# Patient Record
Sex: Female | Born: 1945 | Race: White | Hispanic: No | Smoking: Former smoker
Health system: Southern US, Community
[De-identification: ages and names within clinical notes are randomized; demographics above are authoritative.]

## PROBLEM LIST (undated history)

## (undated) DIAGNOSIS — K56609 Unspecified intestinal obstruction, unspecified as to partial versus complete obstruction: Secondary | ICD-10-CM

## (undated) DIAGNOSIS — F419 Anxiety disorder, unspecified: Secondary | ICD-10-CM

## (undated) DIAGNOSIS — M1990 Unspecified osteoarthritis, unspecified site: Secondary | ICD-10-CM

## (undated) DIAGNOSIS — G629 Polyneuropathy, unspecified: Secondary | ICD-10-CM

## (undated) DIAGNOSIS — E079 Disorder of thyroid, unspecified: Secondary | ICD-10-CM

## (undated) DIAGNOSIS — F32A Depression, unspecified: Secondary | ICD-10-CM

## (undated) DIAGNOSIS — F329 Major depressive disorder, single episode, unspecified: Secondary | ICD-10-CM

## (undated) DIAGNOSIS — Z5189 Encounter for other specified aftercare: Secondary | ICD-10-CM

## (undated) HISTORY — PX: COLON SURGERY: SHX602

## (undated) HISTORY — DX: Unspecified osteoarthritis, unspecified site: M19.90

## (undated) HISTORY — PX: LAPAROSCOPIC CHOLECYSTECTOMY: SUR755

## (undated) HISTORY — PX: APPENDECTOMY: SHX54

## (undated) HISTORY — PX: ABDOMINAL SURGERY: SHX537

## (undated) HISTORY — PX: HERNIA REPAIR: SHX51

## (undated) HISTORY — DX: Encounter for other specified aftercare: Z51.89

## (undated) HISTORY — PX: ABDOMINAL HYSTERECTOMY: SHX81

---

## 2017-03-27 ENCOUNTER — Observation Stay
Admission: EM | Admit: 2017-03-27 | Discharge: 2017-03-29 | Disposition: A | Discharge status: Other Acute Inpatient Hospital | Payer: Medicare Other | Attending: Internal Medicine | Admitting: Internal Medicine

## 2017-03-27 ENCOUNTER — Emergency Department: Payer: Medicare Other

## 2017-03-27 ENCOUNTER — Encounter: Payer: Self-pay | Admitting: Emergency Medicine

## 2017-03-27 DIAGNOSIS — Z79899 Other long term (current) drug therapy: Secondary | ICD-10-CM | POA: Diagnosis not present

## 2017-03-27 DIAGNOSIS — Z87891 Personal history of nicotine dependence: Secondary | ICD-10-CM | POA: Diagnosis not present

## 2017-03-27 DIAGNOSIS — E785 Hyperlipidemia, unspecified: Secondary | ICD-10-CM | POA: Diagnosis not present

## 2017-03-27 DIAGNOSIS — I639 Cerebral infarction, unspecified: Secondary | ICD-10-CM | POA: Diagnosis present

## 2017-03-27 DIAGNOSIS — E039 Hypothyroidism, unspecified: Secondary | ICD-10-CM | POA: Diagnosis not present

## 2017-03-27 DIAGNOSIS — G9589 Other specified diseases of spinal cord: Principal | ICD-10-CM | POA: Insufficient documentation

## 2017-03-27 DIAGNOSIS — G629 Polyneuropathy, unspecified: Secondary | ICD-10-CM | POA: Insufficient documentation

## 2017-03-27 DIAGNOSIS — G9520 Unspecified cord compression: Secondary | ICD-10-CM | POA: Diagnosis present

## 2017-03-27 DIAGNOSIS — F419 Anxiety disorder, unspecified: Secondary | ICD-10-CM | POA: Insufficient documentation

## 2017-03-27 DIAGNOSIS — R262 Difficulty in walking, not elsewhere classified: Secondary | ICD-10-CM | POA: Diagnosis not present

## 2017-03-27 DIAGNOSIS — F329 Major depressive disorder, single episode, unspecified: Secondary | ICD-10-CM | POA: Diagnosis not present

## 2017-03-27 DIAGNOSIS — E538 Deficiency of other specified B group vitamins: Secondary | ICD-10-CM | POA: Insufficient documentation

## 2017-03-27 DIAGNOSIS — R29898 Other symptoms and signs involving the musculoskeletal system: Secondary | ICD-10-CM

## 2017-03-27 DIAGNOSIS — R531 Weakness: Secondary | ICD-10-CM | POA: Diagnosis present

## 2017-03-27 DIAGNOSIS — K589 Irritable bowel syndrome without diarrhea: Secondary | ICD-10-CM | POA: Diagnosis not present

## 2017-03-27 HISTORY — DX: Unspecified intestinal obstruction, unspecified as to partial versus complete obstruction: K56.609

## 2017-03-27 HISTORY — DX: Polyneuropathy, unspecified: G62.9

## 2017-03-27 HISTORY — DX: Major depressive disorder, single episode, unspecified: F32.9

## 2017-03-27 HISTORY — DX: Disorder of thyroid, unspecified: E07.9

## 2017-03-27 HISTORY — DX: Depression, unspecified: F32.A

## 2017-03-27 HISTORY — DX: Anxiety disorder, unspecified: F41.9

## 2017-03-27 LAB — CBC
HEMATOCRIT: 40.2 % (ref 35.0–47.0)
HEMOGLOBIN: 13.5 g/dL (ref 12.0–16.0)
MCH: 28.7 pg (ref 26.0–34.0)
MCHC: 33.6 g/dL (ref 32.0–36.0)
MCV: 85.5 fL (ref 80.0–100.0)
Platelets: 215 10*3/uL (ref 150–440)
RBC: 4.7 MIL/uL (ref 3.80–5.20)
RDW: 13.4 % (ref 11.5–14.5)
WBC: 6.5 10*3/uL (ref 3.6–11.0)

## 2017-03-27 LAB — COMPREHENSIVE METABOLIC PANEL
ALK PHOS: 72 U/L (ref 38–126)
ALT: 22 U/L (ref 14–54)
ANION GAP: 8 (ref 5–15)
AST: 27 U/L (ref 15–41)
Albumin: 4.3 g/dL (ref 3.5–5.0)
BILIRUBIN TOTAL: 0.6 mg/dL (ref 0.3–1.2)
BUN: 16 mg/dL (ref 6–20)
CALCIUM: 9.4 mg/dL (ref 8.9–10.3)
CO2: 26 mmol/L (ref 22–32)
Chloride: 108 mmol/L (ref 101–111)
Creatinine, Ser: 1.05 mg/dL — ABNORMAL HIGH (ref 0.44–1.00)
GFR calc non Af Amer: 52 mL/min — ABNORMAL LOW (ref >60)
GFR, EST AFRICAN AMERICAN: 60 mL/min — AB (ref >60)
Glucose, Bld: 110 mg/dL — ABNORMAL HIGH (ref 65–99)
POTASSIUM: 3.4 mmol/L — AB (ref 3.5–5.1)
Sodium: 142 mmol/L (ref 135–145)
TOTAL PROTEIN: 7.1 g/dL (ref 6.5–8.1)

## 2017-03-27 MED ORDER — ACETAMINOPHEN 650 MG RE SUPP: 650.0000 mg | Freq: Four times a day (QID) | RECTAL | Status: DC | PRN

## 2017-03-27 MED ORDER — DICYCLOMINE HCL 10 MG PO CAPS: 10.0000 mg | ORAL_CAPSULE | Freq: Three times a day (TID) | ORAL | Status: DC | PRN

## 2017-03-27 MED ORDER — ONDANSETRON HCL 4 MG/2ML IJ SOLN: 4.0000 mg | Freq: Four times a day (QID) | INTRAMUSCULAR | Status: DC | PRN

## 2017-03-27 MED ORDER — MIRTAZAPINE 15 MG PO TABS
30.0000 mg | ORAL_TABLET | Freq: Every day | ORAL | Status: DC
  Administered 2017-03-28: 21:00:00 30 mg via ORAL
  Administered 2017-03-28: 15 mg via ORAL
  Filled 2017-03-27 (×2): qty 2

## 2017-03-27 MED ORDER — ACETAMINOPHEN 325 MG PO TABS: 650.0000 mg | ORAL_TABLET | Freq: Four times a day (QID) | ORAL | Status: DC | PRN

## 2017-03-27 MED ORDER — LOPERAMIDE HCL 2 MG PO CAPS
2.0000 mg | ORAL_CAPSULE | ORAL | Status: DC | PRN
  Administered 2017-03-28 (×2): 2 mg via ORAL
  Filled 2017-03-27 (×2): qty 1

## 2017-03-27 MED ORDER — HYDRALAZINE HCL 20 MG/ML IJ SOLN: 10.0000 mg | Freq: Four times a day (QID) | INTRAMUSCULAR | Status: DC | PRN

## 2017-03-27 MED ORDER — ENOXAPARIN SODIUM 40 MG/0.4ML ~~LOC~~ SOLN
40.0000 mg | SUBCUTANEOUS | Status: DC
  Administered 2017-03-28: 21:00:00 40 mg via SUBCUTANEOUS
  Filled 2017-03-27: qty 0.4

## 2017-03-27 MED ORDER — ONDANSETRON HCL 4 MG PO TABS
4.0000 mg | ORAL_TABLET | Freq: Four times a day (QID) | ORAL | Status: DC | PRN
  Administered 2017-03-28: 4 mg via ORAL
  Filled 2017-03-27: qty 1

## 2017-03-27 MED ORDER — ASPIRIN EC 81 MG PO TBEC
81.0000 mg | DELAYED_RELEASE_TABLET | Freq: Every day | ORAL | Status: DC
  Administered 2017-03-28 – 2017-03-29 (×2): 81 mg via ORAL
  Filled 2017-03-27 (×2): qty 1

## 2017-03-27 MED ORDER — LEVOTHYROXINE SODIUM 88 MCG PO TABS
88.0000 ug | ORAL_TABLET | Freq: Every day | ORAL | Status: DC
  Administered 2017-03-28: 10:00:00 88 ug via ORAL
  Filled 2017-03-27: qty 1

## 2017-03-27 MED ORDER — SIMVASTATIN 20 MG PO TABS
20.0000 mg | ORAL_TABLET | Freq: Every day | ORAL | Status: DC
  Administered 2017-03-28 (×2): 20 mg via ORAL
  Filled 2017-03-27 (×2): qty 1

## 2017-03-27 NOTE — ED Provider Notes (Signed)
[facility] Emergency Department Provider Note   ____________________________________________   First MD Initiated Contact with Patient 03/27/17 1745     (approximate)  I have reviewed the triage vital signs and the nursing notes.   HISTORY  Chief Complaint Gait Problem    HPI Alison Torres is a 71 y.o. female here for evaluation of increasing trouble with walking  Patient reports that she's been having lower back discomfort for several weeks, saw a chiropractor and seem to see some improvement except she has noticed a slightly increasing weakness in her left lower leg.She reports she is been eiencing a mild feeling of tingling in the left lower leg, alsohaving a "dropped" and dragging the left leg for a few weeks now. Symptoms seem to be very slowly worseningt is difficulto walk. She reports she'll walk and sometimes will fall but is not injured herself except for some bruises on her knees.  She has seen her doctor at the [place], but reports they only recommended she use a cane recently.  Some slight discomfort in her very lower back for several months, but denies any severe change in pain. No nausea or vomiting. No chest pain. No cold or blue foot.  Denies history of cancer. No trouble urinating. No incontinence. No numbness around the buttock.   Past Medical History:  Diagnosis Date  . Anxiety   . Bowel obstruction (HCC)   . Depression   . Neuropathy   . Thyroid disease     There are no active problems to display for this patient.   Past Surgical History:  Procedure Laterality Date  . ABDOMINAL HYSTERECTOMY    . ABDOMINAL SURGERY      Prior to Admission medications   Not on File    Allergies Codeine  History reviewed. No pertinent family history.  Social History Social History  Substance Use Topics  . Smoking status: Never Smoker  . Smokeless tobacco: Never Used  . Alcohol use No    Review of Systems Constitutional: No  fever/chills Eyes: No visual changes. ENT: No sore throat. Cardiovascular: Denies chest pain. Respiratory: Denies shortness of breath. Gastrointestinal: No abdominal pain.  No nausea, no vomiting.  No diarrhea.  No constipation. Genitourinary: Negative for dysuria. Musculoskeletal: Negative for back pain. Skin: Negative for rash. Neurological: Negative for headaches, trouble speaking, or weakness in her face and arms or hands. her right leg does not feel weak, but her left leg definitely    ____________________________________________   PHYSICAL EXAM:  VITAL SIGNS: ED Triage Vitals  Enc Vitals Group     BP 03/27/17 1720 (!) 147/83     Pulse Rate 03/27/17 1720 (!) 106     Resp 03/27/17 1720 18     Temp 03/27/17 1720 98.4 F (36.9 C)     Temp Source 03/27/17 1720 Oral     SpO2 03/27/17 1720 96 %     Weight 03/27/17 1720 178 lb (80.7 kg)     Height 03/27/17 1720 5\' 6"  (1.676 m)     Head Circumference --      Peak Flow --      Pain Score 03/27/17 1719 0     Pain Loc --      Pain Edu? --      Excl. in GC? --     Constitutional: Alert and oriented. Well appearing and in no acute distress. Eyes: Conjunctivae are normal. Head: Atraumatic. Nose: No congestion/rhinnorhea. Mouth/Throat: Mucous membranes are moist. Neck: No stridor.  Cardiovascular: Normal rate, regular rhythm. Grossly normal heart sounds.  Good peripheral circulation. Respiratory: Normal respiratory effort.  No retractions. Lungs CTAB. Gastrointestinal: Soft and nontender. No distention. Musculoskeletal: No lower extremity tenderness nor edema. Neurologic:  Normal speech and language.   Lower Extremities  No edema. Normal DP/PT pulses are dopplerable bilateral with good cap refill.   Normal neuro-motor function lower extremities bilateral.  RIGHT Right lower extremity demonstrates normal strength, good use of all muscles. No edema bruising or contusions of the right hip, right knee, right ankle. Full  range of motion of the right lower extremity without pain. No pain on axial loading. No evidence of trauma.  LEFT Left lower extremity demonstrates decreased strength, especially notable with foot dorsiflexion where she has about 2 out of 5 s,4-5 to plantar flexion.  No edema bruising or contusions of the hip, ankle.mild contusion across the left knee but denies pain. Full range of motion of the left lower extremity without painto passive mo. No pain on axial loading. Able to wiggle the toes bilateral, slightly worse in the left leg. Minimally diminished reflexes in the left knee, normal reflexes right leg.   Skin:  Skin is warm, dry and intact. No rash noted. Psychiatric: Mood and affect are normal. Speech and behavior are normal.  ____________________________________________   LABS (all labs ordered are listed, but only abnormal results are displayed)  Labs Reviewed  COMPREHENSIVE METABOLIC PANEL - Abnormal; Notable for the following:       Result Value   Potassium 3.4 (*)    Glucose, Bld 110 (*)    Creatinine, Ser 1.05 (*)    GFR calc non Af Amer 52 (*)    GFR calc Af Amer 60 (*)    All other components within normal limits  CBC   ____________________________________________  EKG   ____________________________________________  RADIOLOGY  Mr Lumbar Spine Wo Contrast  Result Date: 03/27/2017 CLINICAL DATA:  Initial evaluation for back pain, lower extremity weakness. EXAM: MRI LUMBAR SPINE WITHOUT CONTRAST TECHNIQUE: Multiplanar, multisequence MR imaging of the lumbar spine was performed. No intravenous contrast was administered. COMPARISON:  None available. FINDINGS: Segmentation: Study degraded by motion artifact. Normal segmentation. Lowest well-formed disc labeled the L5-S1 level. Alignment: Mild levoscoliosis. Vertebral bodies otherwise normally aligned with preservation of the normal lumbar lordosis. No listhesis. Vertebrae: Vertebral body heights are maintained. No evidence  for acute or chronic fracture. Schmorl's node deformity noted about the L3-4 and L4-5 interspaces. Signal intensity within the vertebral body bone marrow within normal limits. No worrisome osseous lesions. No abnormal marrow edema. Conus medullaris: Extends to the L1 level and appears normal. Paraspinal and other soft tissues: Paraspinous soft tissues within normal limits. Visualized visceral structures are normal. Disc levels: L1-2:  Minimal annular disc bulge.  No stenosis. L2-3:  Mild annular disc bulge.  No stenosis. L3-4: Diffuse degenerative disc bulge with intervertebral disc space narrowing and mild reactive endplate changes. No focal disc protrusion. Facet and ligamentum flavum hypertrophy. Mild spinal stenosis. No significant foraminal encroachment. L4-5: Mild diffuse disc bulge, eccentric to the right. Right far lateral reactive endplate changes. Right greater than left facet arthrosis. Small reactive effusions present within the bilateral L4-5 facets. No significant canal or lateral recess stenosis. Mild right L4 foraminal stenosis. No significant left foraminal encroachment. L5-S1:  Negative disc.  Mild facet hypertrophy.  No stenosis. IMPRESSION: 1. No acute abnormality within the lumbar spine. No significant canal stenosis or evidence for cord compression. 2. Mild degenerative disc disease and facet  arthrosis at L3-4 and L4-5 as above without significant stenosis. Electronically Signed   By: Rise Mu M.D.   On: 03/27/2017 19:16    ____________________________________________   PROCEDURES  Procedure(s) performed: None  Procedures  Critical Care performed: No  ____________________________________________   INITIAL IMPRESSION / ASSESSMENT AND PLAN / ED COURSE  Pertinent labs & imaging results that were available during my care of the patient were reviewed by me and considered in my medical decision making (see chart for details).  atient presents for evaluation ocreasing,  rather ins worsening of weakness in leg primarily e left and difficulty walking. No discernible deficits in the right lower leg, but clearly weakness especially to dorsiflexion in the left footmay be some mild weakness involving the left hip flexors and extensors. This is associated with a mild lower back painfectious symptoms. No history cancer. Denies significant pain.Normal neurologic exam the cranial nerves and upper extremities bilateral. No falls or injuries except onto her knees yesterday.   ----------------------------------------- 8:27 PM on 03/27/2017 -----------------------------------------  MRI unrevealing of lesion to explain symptoms. Her symptoms sound to be that of a possibly progressive neurologic disorder. Discussed with patient, and believe she is stable for outpatient follow-up with neurology at this time, but she is able and willing to see telemetry neurology tonight and due to often prolonged time for clinic visits and her worsening disease we discussed and I will have a telemetry neurology consultation performed for further recommendations and examination.  Ongoing care assigned to Dr. Roxan Hockey, follow up on CT of the head and likely disposition based on telemetry neurology recommendations.       ____________________________________________   FINAL CLINICAL IMPRESSION(S) / ED DIAGNOSES  Final diagnoses:  Left leg weakness      NEW MEDICATIONS STARTED DURING THIS VISIT:  New Prescriptions   No medications on file     Note:  This document was prepared using Dragon voice recognition software and may include unintentional dictation errors.     Sharyn Creamer, MD 03/27/17 2039

## 2017-03-27 NOTE — ED Triage Notes (Signed)
Pt states has been having difficulty walking x 6-7 weeks. Hx of neuropathy in feet. States went to chiropractor and it helped but not better. States can take a few steps but falls. Uses cane and today started using a walker. [facility] .

## 2017-03-27 NOTE — ED Provider Notes (Addendum)
Patient received in sign-out from Dr. Fanny BienQuale.  Workup and evaluation pending neuro consult.  Neurology is counseled patient based on her symptoms of progressively worsening weakness and inability to walk he has recommended MRI brain, C-spine and thoracic spine due to concern for mass occupying lesion versus demyelinating disease. This is normal other differentials do include Guillain-Barr. Do not feel that lumbar puncture emergently indicated at this moment. At this point I do feel the patient will benefit from admission to the hospital for further evaluation and management.      Willy Eddyobinson, Caidan Hubbert, MD 03/27/17 2109    Willy Eddyobinson, Derion Kreiter, MD 03/27/17 2141

## 2017-03-27 NOTE — ED Notes (Signed)
Called MRI tech

## 2017-03-27 NOTE — ED Notes (Signed)
[facility]

## 2017-03-27 NOTE — H&P (Signed)
[facility] - [place] [facility]   PATIENT NAME: Alison Torres    MR#:  [id number]  DATE OF BIRTH:  1946/02/23  DATE OF ADMISSION:  03/27/2017  PRIMARY CARE PHYSICIAN: [facility]   REQUESTING/REFERRING PHYSICIAN: Dr. Willy Eddy  CHIEF COMPLAINT:   Chief Complaint  Patient presents with  . Gait Problem    HISTORY OF PRESENT ILLNESS:  Alison Torres  is a 71 y.o. female with a known history of Depression, previous history of bowel obstruction, neuropathy, hypothyroidism, anxiety who presents to the hospital due to difficulty walking and frequent falls. Patient says that she has not been able to walk now for the past 2 weeks and has been having frequent falls at home. She describes a feeling that she gets up and bears weight on her legs and her legs gave way and she falls. She has multiple bruises on lower extremities from her frequent falls. She presented to the ER for further evaluation. Patient underwent a CT scan of the head and MRI of the lumbar spine which showed no acute pathology. Tele-Neurology was consulted who recommended admission and further neurological evaluation and further testing. She denies any chest pain, shortness of breath, nausea, vomiting, abdominal pain, fever or chills cough or any other associated symptoms. Patient does complain of some numbness and tingling of her lower extremities which is chronic and attributed to some neuropathy.  PAST MEDICAL HISTORY:   Past Medical History:  Diagnosis Date  . Anxiety   . Bowel obstruction (HCC)   . Depression   . Neuropathy   . Thyroid disease     PAST SURGICAL HISTORY:   Past Surgical History:  Procedure Laterality Date  . ABDOMINAL HYSTERECTOMY    . ABDOMINAL SURGERY      SOCIAL HISTORY:   Social History  Substance Use Topics  . Smoking status: Former Smoker    Packs/day: 1.00    Years: 20.00    Types: Cigarettes  . Smokeless tobacco: Never Used  . Alcohol use Yes      Comment: Used to drink heavily but quit 30 yrs ago.     FAMILY HISTORY:   Family History  Problem Relation Age of Onset  . Adopted: Yes    DRUG ALLERGIES:   Allergies  Allergen Reactions  . Codeine   . Trazodone And Nefazodone     REVIEW OF SYSTEMS:   Review of Systems  Constitutional: Negative for fever and weight loss.  HENT: Negative for congestion, nosebleeds and tinnitus.   Eyes: Negative for blurred vision, double vision and redness.  Respiratory: Negative for cough, hemoptysis and shortness of breath.   Cardiovascular: Negative for chest pain, orthopnea, leg swelling and PND.  Gastrointestinal: Negative for abdominal pain, diarrhea, melena, nausea and vomiting.  Genitourinary: Negative for dysuria, hematuria and urgency.  Musculoskeletal: Positive for falls. Negative for joint pain.  Neurological: Positive for tingling and weakness. Negative for dizziness, sensory change, focal weakness, seizures and headaches.  Endo/Heme/Allergies: Negative for polydipsia. Does not bruise/bleed easily.  Psychiatric/Behavioral: Negative for depression and memory loss. The patient is not nervous/anxious.     MEDICATIONS AT HOME:   Prior to Admission medications   Medication Sig Start Date End Date Taking? Authorizing Provider  dicyclomine (BENTYL) 10 MG capsule Take 10 mg by mouth 3 (three) times daily as needed for spasms.   Yes [provider]  ibuprofen (ADVIL,MOTRIN) 800 MG tablet Take 800 mg by mouth daily as needed.   Yes [provider]  levothyroxine (SYNTHROID, LEVOTHROID) 88 MCG tablet Take 88 mcg by mouth daily before breakfast.   Yes [provider]  loperamide (IMODIUM) 2 MG capsule Take by mouth 2 (two) times daily as needed for diarrhea or loose stools.   Yes [provider]  mirtazapine (REMERON) 30 MG tablet Take 30 mg by mouth at bedtime.   Yes [provider]  simvastatin (ZOCOR) 20 MG tablet Take 20 mg by mouth at  bedtime.   Yes [provider]      VITAL SIGNS:  Blood pressure (!) 184/84, pulse 81, temperature 98.4 F (36.9 C), temperature source Oral, resp. rate 17, height 5\' 6"  (1.676 m), weight 80.7 kg (178 lb), SpO2 97 %.  PHYSICAL EXAMINATION:  Physical Exam  GENERAL:  71 y.o.-year-old patient lying in the bed in no acute distress.  EYES: Pupils equal, round, reactive to light and accommodation. No scleral icterus. Extraocular muscles intact.  HEENT: Head atraumatic, normocephalic. Oropharynx and nasopharynx clear. No oropharyngeal erythema, moist oral mucosa  NECK:  Supple, no jugular venous distention. No thyroid enlargement, no tenderness.  LUNGS: Normal breath sounds bilaterally, no wheezing, rales, rhonchi. No use of accessory muscles of respiration.  CARDIOVASCULAR: S1, S2 RRR. No murmurs, rubs, gallops, clicks.  ABDOMEN: Soft, nontender, nondistended. Bowel sounds present. No organomegaly or mass.  EXTREMITIES: No pedal edema, cyanosis, or clubbing. + 2 pedal & radial pulses b/l.   NEUROLOGIC: Cranial nerves II through XII are intact. 3 out of 5 strength in the left lower extremities compared to the right. PSYCHIATRIC: The patient is alert and oriented x 3.  SKIN: No obvious rash, lesion, or ulcer.   LABORATORY PANEL:   CBC  Recent Labs Lab 03/27/17 1800  WBC 6.5  HGB 13.5  HCT 40.2  PLT 215   ------------------------------------------------------------------------------------------------------------------  Chemistries   Recent Labs Lab 03/27/17 1800  NA 142  K 3.4*  CL 108  CO2 26  GLUCOSE 110*  BUN 16  CREATININE 1.05*  CALCIUM 9.4  AST 27  ALT 22  ALKPHOS 72  BILITOT 0.6   ------------------------------------------------------------------------------------------------------------------  Cardiac Enzymes No results for input(s): TROPONINI in the last 168  hours. ------------------------------------------------------------------------------------------------------------------  RADIOLOGY:  Ct Head Wo Contrast  Result Date: 03/27/2017 CLINICAL DATA:  Subacute neuro deficits EXAM: CT HEAD WITHOUT CONTRAST TECHNIQUE: Contiguous axial images were obtained from the base of the skull through the vertex without intravenous contrast. COMPARISON:  None. FINDINGS: Brain: No evidence of acute infarction, hemorrhage, hydrocephalus, extra-axial collection or mass lesion/mass effect. Vascular: No hyperdense vessel or unexpected calcification. Skull: Negative Sinuses/Orbits: Negative Other: None IMPRESSION: Negative CT head Electronically Signed   By: Marlan Palau M.D.   On: 03/27/2017 20:54   Mr Lumbar Spine Wo Contrast  Result Date: 03/27/2017 CLINICAL DATA:  Initial evaluation for back pain, lower extremity weakness. EXAM: MRI LUMBAR SPINE WITHOUT CONTRAST TECHNIQUE: Multiplanar, multisequence MR imaging of the lumbar spine was performed. No intravenous contrast was administered. COMPARISON:  None available. FINDINGS: Segmentation: Study degraded by motion artifact. Normal segmentation. Lowest well-formed disc labeled the L5-S1 level. Alignment: Mild levoscoliosis. Vertebral bodies otherwise normally aligned with preservation of the normal lumbar lordosis. No listhesis. Vertebrae: Vertebral body heights are maintained. No evidence for acute or chronic fracture. Schmorl's node deformity noted about the L3-4 and L4-5 interspaces. Signal intensity within the vertebral body bone marrow within normal limits. No worrisome osseous lesions. No abnormal marrow edema. Conus medullaris: Extends to the L1 level and appears normal. Paraspinal and other soft tissues:  Paraspinous soft tissues within normal limits. Visualized visceral structures are normal. Disc levels: L1-2:  Minimal annular disc bulge.  No stenosis. L2-3:  Mild annular disc bulge.  No stenosis. L3-4: Diffuse  degenerative disc bulge with intervertebral disc space narrowing and mild reactive endplate changes. No focal disc protrusion. Facet and ligamentum flavum hypertrophy. Mild spinal stenosis. No significant foraminal encroachment. L4-5: Mild diffuse disc bulge, eccentric to the right. Right far lateral reactive endplate changes. Right greater than left facet arthrosis. Small reactive effusions present within the bilateral L4-5 facets. No significant canal or lateral recess stenosis. Mild right L4 foraminal stenosis. No significant left foraminal encroachment. L5-S1:  Negative disc.  Mild facet hypertrophy.  No stenosis. IMPRESSION: 1. No acute abnormality within the lumbar spine. No significant canal stenosis or evidence for cord compression. 2. Mild degenerative disc disease and facet arthrosis at L3-4 and L4-5 as above without significant stenosis. Electronically Signed   By: Rise MuBenjamin  McClintock M.D.   On: 03/27/2017 19:16     IMPRESSION AND PLAN:   71 year old female with past medical history of hypothyroidism, history of bowel obstruction, neuropathy, anxiety who presented to the hospital due to frequent falls, lower extremity weakness and difficulty walking.  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 -CT head is negative for acute pathology. Patient also had a lumbar spine MRI which shows no evidence of spinal stenosis or any acute process to explain her weakness. - Tele neurology was consulted in the ER recommended admission and further stroke workup. -We'll get MRI of the brain, carotid duplex, echocardiogram, get a neurology consult. Start patient on aspirin. Get a physical therapy evaluation.  2. History of IBS and bowel obstruction-continue Bentyl  3. Hypothyroidism-continue Synthroid.  4. Hyperlipidemia-continue Zocor.    All the records are reviewed and case discussed with ED provider. Management plans  discussed with the patient, family and they are in agreement.  CODE STATUS: Full  TOTAL TIME TAKING CARE OF THIS PATIENT: 45 minutes.    Houston SirenSAINANI,VIVEK J M.D on 03/27/2017 at 9:34 PM  Between 7am to 6pm - Pager - [phone]  After 6pm go to [email or web address] - password [email or web address]  [facility]  Office  [phone]  CC: Primary care physician; [facility]

## 2017-03-27 NOTE — ED Notes (Signed)
Performed doppler on bilateral feet - right dorsal foot 68 bpm; left dorsal foot 64 bpm

## 2017-03-28 ENCOUNTER — Observation Stay: Payer: Medicare Other

## 2017-03-28 ENCOUNTER — Observation Stay (HOSPITAL_BASED_OUTPATIENT_CLINIC_OR_DEPARTMENT_OTHER)
Admit: 2017-03-28 | Discharge: 2017-03-28 | Disposition: A | Discharge status: Home / Self Care | Payer: Medicare Other | Attending: Specialist | Admitting: Specialist

## 2017-03-28 DIAGNOSIS — G959 Disease of spinal cord, unspecified: Secondary | ICD-10-CM | POA: Diagnosis not present

## 2017-03-28 DIAGNOSIS — I6350 Cerebral infarction due to unspecified occlusion or stenosis of unspecified cerebral artery: Secondary | ICD-10-CM

## 2017-03-28 DIAGNOSIS — R29898 Other symptoms and signs involving the musculoskeletal system: Secondary | ICD-10-CM | POA: Diagnosis not present

## 2017-03-28 LAB — T4, FREE: FREE T4: 0.84 ng/dL (ref 0.61–1.12)

## 2017-03-28 LAB — MAGNESIUM: Magnesium: 2 mg/dL (ref 1.7–2.4)

## 2017-03-28 LAB — ECHOCARDIOGRAM COMPLETE
HEIGHTINCHES: 66 in
WEIGHTICAEL: 2916.8 [oz_av]

## 2017-03-28 LAB — BASIC METABOLIC PANEL
Anion gap: 7 (ref 5–15)
BUN: 19 mg/dL (ref 6–20)
CALCIUM: 8.9 mg/dL (ref 8.9–10.3)
CO2: 27 mmol/L (ref 22–32)
Chloride: 108 mmol/L (ref 101–111)
Creatinine, Ser: 1.07 mg/dL — ABNORMAL HIGH (ref 0.44–1.00)
GFR, EST AFRICAN AMERICAN: 59 mL/min — AB (ref >60)
GFR, EST NON AFRICAN AMERICAN: 51 mL/min — AB (ref >60)
Glucose, Bld: 115 mg/dL — ABNORMAL HIGH (ref 65–99)
POTASSIUM: 3.3 mmol/L — AB (ref 3.5–5.1)
Sodium: 142 mmol/L (ref 135–145)

## 2017-03-28 LAB — TSH: TSH: 14.442 u[IU]/mL — ABNORMAL HIGH (ref 0.350–4.500)

## 2017-03-28 LAB — CBC
HEMATOCRIT: 36.1 % (ref 35.0–47.0)
HEMOGLOBIN: 12.2 g/dL (ref 12.0–16.0)
MCH: 28.9 pg (ref 26.0–34.0)
MCHC: 33.8 g/dL (ref 32.0–36.0)
MCV: 85.3 fL (ref 80.0–100.0)
Platelets: 192 10*3/uL (ref 150–440)
RBC: 4.23 MIL/uL (ref 3.80–5.20)
RDW: 13.2 % (ref 11.5–14.5)
WBC: 5.6 10*3/uL (ref 3.6–11.0)

## 2017-03-28 LAB — FOLATE: Folate: 7.7 ng/mL (ref >5.9)

## 2017-03-28 LAB — SEDIMENTATION RATE: SED RATE: 12 mm/h (ref 0–30)

## 2017-03-28 LAB — CKMB (ARMC ONLY): CK, MB: 2 ng/mL (ref 0.5–5.0)

## 2017-03-28 LAB — VITAMIN B12: Vitamin B-12: 251 pg/mL (ref 180–914)

## 2017-03-28 MED ORDER — IBUPROFEN 400 MG PO TABS: 400.0000 mg | ORAL_TABLET | Freq: Four times a day (QID) | ORAL | Status: DC | PRN

## 2017-03-28 MED ORDER — DIAZEPAM 5 MG PO TABS
5.0000 mg | ORAL_TABLET | Freq: Once | ORAL | Status: AC | PRN
  Administered 2017-03-29: 5 mg via ORAL
  Filled 2017-03-28: qty 1

## 2017-03-28 MED ORDER — ZOLPIDEM TARTRATE 5 MG PO TABS
5.0000 mg | ORAL_TABLET | Freq: Every evening | ORAL | Status: DC | PRN
  Administered 2017-03-28: 22:00:00 5 mg via ORAL
  Filled 2017-03-28: qty 1

## 2017-03-28 MED ORDER — POTASSIUM CHLORIDE CRYS ER 20 MEQ PO TBCR
40.0000 meq | EXTENDED_RELEASE_TABLET | ORAL | Status: AC
  Administered 2017-03-28 (×2): 40 meq via ORAL
  Filled 2017-03-28 (×2): qty 2

## 2017-03-28 MED ORDER — LEVOTHYROXINE SODIUM 100 MCG PO TABS
100.0000 ug | ORAL_TABLET | Freq: Every day | ORAL | Status: DC
  Administered 2017-03-29: 08:00:00 100 ug via ORAL
  Filled 2017-03-28: qty 1

## 2017-03-28 MED ORDER — LORAZEPAM 1 MG PO TABS: 1.0000 mg | ORAL_TABLET | Freq: Once | ORAL | Status: DC | PRN

## 2017-03-28 MED ORDER — KETOROLAC TROMETHAMINE 15 MG/ML IJ SOLN
15.0000 mg | Freq: Three times a day (TID) | INTRAMUSCULAR | Status: DC | PRN
  Administered 2017-03-29: 15 mg via INTRAVENOUS
  Filled 2017-03-28 (×2): qty 1

## 2017-03-28 MED ORDER — CYANOCOBALAMIN 1000 MCG/ML IJ SOLN
1000.0000 ug | Freq: Every day | INTRAMUSCULAR | Status: DC
  Administered 2017-03-28 – 2017-03-29 (×2): 1000 ug via SUBCUTANEOUS
  Filled 2017-03-28 (×2): qty 1

## 2017-03-28 NOTE — Plan of Care (Signed)
Problem: Education: Goal: Knowledge of [facility] Education information/materials will improve Outcome: Progressing Oriented to unit, including call bell, FPP, dietary.  BP elevated, taken on LUA.  Rechecked on L wrist, VSS.  NIH 4 for LLE weakness/ataxia.  Neuro checks stable.  Denies pain.  Reports insomnia, received scheduled mirtazapine 15mg  per pt preference, states need for stronger sleep med if still admitted tonight.  No other complaints overnight.  Compliant w/ q2h repositioning for skin integrity.  Bed in low position, bed alarm on.  Call bell within reach, WCTM.

## 2017-03-28 NOTE — Care Management (Signed)
Patient from home with Aunt and daughter lives next door. Patient states she cannot walk however this is something new. PT evaluation pending.

## 2017-03-28 NOTE — Consult Note (Signed)
Reason for Consult:LE weakness Referring Physician: Sudini  CC: LE weakness  HPI: Alison Torres is an 71 y.o. female who reports that about 7-8 weeks ago she began to notice lower extremity weakness.  She was still able to walk at that time but progressively she became worse.  She saw the chiropractor but this has not helped.  She has fallen frequently.  Over the past 2-3 days she has been unable to walk.  Her left leg is weaker than her right.  She  has no bowel or bladder incontinence.  Although with chronic back pain this has been minimal over this period of time.   Patient with a history of neuropathy that has not progressed over this period of time. At baseline patient is in the bed most of the day on her tablet.    Past Medical History:  Diagnosis Date  . Anxiety   . Bowel obstruction ([place])   . Depression   . Neuropathy   . Thyroid disease     Past Surgical History:  Procedure Laterality Date  . ABDOMINAL HYSTERECTOMY    . ABDOMINAL SURGERY      Family History  Problem Relation Age of Onset  . Adopted: Yes    Social History:  reports that she has quit smoking. Her smoking use included Cigarettes. She has a 20.00 pack-year smoking history. She has never used smokeless tobacco. She reports that she drinks alcohol. She reports that she does not use drugs.  Allergies  Allergen Reactions  . Codeine   . Trazodone And Nefazodone     Medications:  I have reviewed the patient's current medications. Prior to Admission:  Prescriptions Prior to Admission  Medication Sig Dispense Refill Last Dose  . dicyclomine (BENTYL) 10 MG capsule Take 10 mg by mouth 3 (three) times daily as needed for spasms.   03/27/2017 at Unknown time  . ibuprofen (ADVIL,MOTRIN) 800 MG tablet Take 800 mg by mouth daily as needed.     Marland Kitchen levothyroxine (SYNTHROID, LEVOTHROID) 88 MCG tablet Take 88 mcg by mouth daily before breakfast.   03/27/2017 at Unknown time  . loperamide (IMODIUM) 2 MG capsule Take by  mouth 2 (two) times daily as needed for diarrhea or loose stools.     . mirtazapine (REMERON) 30 MG tablet Take 30 mg by mouth at bedtime.   03/26/2017 at Unknown time  . simvastatin (ZOCOR) 20 MG tablet Take 20 mg by mouth at bedtime.   03/26/2017 at Unknown time   Scheduled: . aspirin EC  81 mg Oral Daily  . enoxaparin (LOVENOX) injection  40 mg Subcutaneous Q24H  . levothyroxine  88 mcg Oral QAC breakfast  . mirtazapine  30 mg Oral QHS  . simvastatin  20 mg Oral QHS    ROS: History obtained from the patient  General ROS: negative for - chills, fatigue, fever, night sweats, weight gain or weight loss Psychological ROS: negative for - behavioral disorder, hallucinations, memory difficulties, mood swings or suicidal ideation Ophthalmic ROS: negative for - blurry vision, double vision, eye pain or loss of vision ENT ROS: negative for - epistaxis, nasal discharge, oral lesions, sore throat, tinnitus or vertigo Allergy and Immunology ROS: negative for - hives or itchy/watery eyes Hematological and Lymphatic ROS: negative for - bleeding problems, bruising or swollen lymph nodes Endocrine ROS: negative for - galactorrhea, hair pattern changes, polydipsia/polyuria or temperature intolerance Respiratory ROS: negative for - cough, hemoptysis, shortness of breath or wheezing Cardiovascular ROS: negative for - chest pain, dyspnea  on exertion, edema or irregular heartbeat Gastrointestinal ROS: negative for - abdominal pain, diarrhea, hematemesis, nausea/vomiting or stool incontinence Genito-Urinary ROS: negative for - dysuria, hematuria, incontinence or urinary frequency/urgency Musculoskeletal ROS: back pain Neurological ROS: as noted in HPI Dermatological ROS: negative for rash and skin lesion changes  Physical Examination: Blood pressure (!) 150/66, pulse 78, temperature 98.2 F (36.8 C), temperature source Oral, resp. rate 18, height 5' 6"  (1.676 m), weight 82.7 kg (182 lb 4.8 oz), SpO2 97  %.  HEENT-  Normocephalic, no lesions, without obvious abnormality.  Normal external eye and conjunctiva.  Normal TM's bilaterally.  Normal auditory canals and external ears. Normal external nose, mucus membranes and septum.  Normal pharynx. Cardiovascular- S1, S2 normal, pulses palpable throughout   Lungs- chest clear, no wheezing, rales, normal symmetric air entry Abdomen- soft, non-tender; bowel sounds normal; no masses,  no organomegaly Extremities- no edema Lymph-no adenopathy palpable Musculoskeletal-no joint tenderness, deformity or swelling Skin-multiple lower extremity bruises  Neurological Examination   Mental Status: Alert, oriented, thought content appropriate.  Speech fluent without evidence of aphasia.  Able to follow 3 step commands without difficulty. Cranial Nerves: II: Discs flat bilaterally; Visual fields grossly normal, pupils equal, round, reactive to light and accommodation III,IV, VI: ptosis not present, extra-ocular motions intact bilaterally V,VII: smile symmetric, facial light touch sensation normal bilaterally VIII: hearing normal bilaterally IX,X: gag reflex present XI: bilateral shoulder shrug XII: midline tongue extension Motor: Right : Upper extremity   5/5    Left:     Upper extremity   5/5 The right lower extremity with hip flexor weakness at 3/5.  Patient 5/5 otherwise in the RLE.  3-3+/5 throughout in the LLE.   Sensory: Pinprick and light touch decreased to just above the knees in both lower extremities Deep Tendon Reflexes: 2+ and symmetric throughout Plantars: Right: upgoing   Left: upgoing Cerebellar: Normal finger-to-nose testing bilaterally Gait: not tested due to safety concerns    Laboratory Studies:   Basic Metabolic Panel:  Recent Labs Lab 03/27/17 1800 03/28/17 0416  NA 142 142  K 3.4* 3.3*  CL 108 108  CO2 26 27  GLUCOSE 110* 115*  BUN 16 19  CREATININE 1.05* 1.07*  CALCIUM 9.4 8.9    Liver Function Tests:  Recent  Labs Lab 03/27/17 1800  AST 27  ALT 22  ALKPHOS 72  BILITOT 0.6  PROT 7.1  ALBUMIN 4.3   No results for input(s): LIPASE, AMYLASE in the last 168 hours. No results for input(s): AMMONIA in the last 168 hours.  CBC:  Recent Labs Lab 03/27/17 1800 03/28/17 0416  WBC 6.5 5.6  HGB 13.5 12.2  HCT 40.2 36.1  MCV 85.5 85.3  PLT 215 192    Cardiac Enzymes: No results for input(s): CKTOTAL, CKMB, CKMBINDEX, TROPONINI in the last 168 hours.  BNP: Invalid input(s): POCBNP  CBG: No results for input(s): GLUCAP in the last 168 hours.  Microbiology: No results found for this or any previous visit.  Coagulation Studies: No results for input(s): LABPROT, INR in the last 72 hours.  Urinalysis: No results for input(s): COLORURINE, LABSPEC, PHURINE, GLUCOSEU, HGBUR, BILIRUBINUR, KETONESUR, PROTEINUR, UROBILINOGEN, NITRITE, LEUKOCYTESUR in the last 168 hours.  Invalid input(s): APPERANCEUR  Lipid Panel:  No results found for: CHOL, TRIG, HDL, CHOLHDL, VLDL, LDLCALC  HgbA1C: No results found for: HGBA1C  Urine Drug Screen:  No results found for: LABOPIA, COCAINSCRNUR, LABBENZ, AMPHETMU, THCU, LABBARB  Alcohol Level: No results for input(s): ETH in the  last 168 hours.   Imaging: Ct Head Wo Contrast  Result Date: 03/27/2017 CLINICAL DATA:  Subacute neuro deficits EXAM: CT HEAD WITHOUT CONTRAST TECHNIQUE: Contiguous axial images were obtained from the base of the skull through the vertex without intravenous contrast. COMPARISON:  None. FINDINGS: Brain: No evidence of acute infarction, hemorrhage, hydrocephalus, extra-axial collection or mass lesion/mass effect. Vascular: No hyperdense vessel or unexpected calcification. Skull: Negative Sinuses/Orbits: Negative Other: None IMPRESSION: Negative CT head Electronically Signed   By: Franchot Gallo M.D.   On: 03/27/2017 20:54   Mr Lumbar Spine Wo Contrast  Result Date: 03/27/2017 CLINICAL DATA:  Initial evaluation for back pain, lower  extremity weakness. EXAM: MRI LUMBAR SPINE WITHOUT CONTRAST TECHNIQUE: Multiplanar, multisequence MR imaging of the lumbar spine was performed. No intravenous contrast was administered. COMPARISON:  None available. FINDINGS: Segmentation: Study degraded by motion artifact. Normal segmentation. Lowest well-formed disc labeled the L5-S1 level. Alignment: Mild levoscoliosis. Vertebral bodies otherwise normally aligned with preservation of the normal lumbar lordosis. No listhesis. Vertebrae: Vertebral body heights are maintained. No evidence for acute or chronic fracture. Schmorl's node deformity noted about the L3-4 and L4-5 interspaces. Signal intensity within the vertebral body bone marrow within normal limits. No worrisome osseous lesions. No abnormal marrow edema. Conus medullaris: Extends to the L1 level and appears normal. Paraspinal and other soft tissues: Paraspinous soft tissues within normal limits. Visualized visceral structures are normal. Disc levels: L1-2:  Minimal annular disc bulge.  No stenosis. L2-3:  Mild annular disc bulge.  No stenosis. L3-4: Diffuse degenerative disc bulge with intervertebral disc space narrowing and mild reactive endplate changes. No focal disc protrusion. Facet and ligamentum flavum hypertrophy. Mild spinal stenosis. No significant foraminal encroachment. L4-5: Mild diffuse disc bulge, eccentric to the right. Right far lateral reactive endplate changes. Right greater than left facet arthrosis. Small reactive effusions present within the bilateral L4-5 facets. No significant canal or lateral recess stenosis. Mild right L4 foraminal stenosis. No significant left foraminal encroachment. L5-S1:  Negative disc.  Mild facet hypertrophy.  No stenosis. IMPRESSION: 1. No acute abnormality within the lumbar spine. No significant canal stenosis or evidence for cord compression. 2. Mild degenerative disc disease and facet arthrosis at L3-4 and L4-5 as above without significant stenosis.  Electronically Signed   By: Jeannine Boga M.D.   On: 03/27/2017 19:16   US Carotid Bilateral  Result Date: 03/28/2017 CLINICAL DATA:  71 year old female with a history of stroke. Cardiovascular risk factors include hyperlipidemia EXAM: BILATERAL CAROTID DUPLEX ULTRASOUND TECHNIQUE: Pearline Cables scale imaging, color Doppler and duplex ultrasound were performed of bilateral carotid and vertebral arteries in the neck. COMPARISON:  No prior duplex FINDINGS: Criteria: Quantification of carotid stenosis is based on velocity parameters that correlate the residual internal carotid diameter with NASCET-based stenosis levels, using the diameter of the distal internal carotid lumen as the denominator for stenosis measurement. The following velocity measurements were obtained: RIGHT ICA:  Systolic 80 cm/sec, Diastolic 31 cm/sec CCA:  80 cm/sec SYSTOLIC ICA/CCA RATIO:  1.0 ECA:  83 cm/sec LEFT ICA:  Systolic 90 cm/sec, Diastolic 24 cm/sec CCA:  69 cm/sec SYSTOLIC ICA/CCA RATIO:  1.3 ECA:  79 cm/sec Right Brachial SBP: Not acquired Left Brachial SBP: Not acquired RIGHT CAROTID ARTERY: No significant calcifications of the right common carotid artery. Intermediate waveform maintained. Heterogeneous and partially calcified plaque at the right carotid bifurcation. No significant lumen shadowing. Low resistance waveform of the right ICA. Mild tortuosity RIGHT VERTEBRAL ARTERY: Antegrade flow with low resistance waveform. LEFT  CAROTID ARTERY: No significant calcifications of the left common carotid artery. Intermediate waveform maintained. Heterogeneous and partially calcified plaque at the left carotid bifurcation without significant lumen shadowing. Low resistance waveform of the left ICA. Mild tortuosity LEFT VERTEBRAL ARTERY:  Antegrade flow with low resistance waveform. IMPRESSION: Color duplex indicates minimal heterogeneous and calcified plaque, with no hemodynamically significant stenosis by duplex criteria in the  extracranial cerebrovascular circulation. Signed, Dulcy Fanny. Earleen Newport, DO [facility] Radiology Electronically Signed   By: Corrie Mckusick D.O.   On: 03/28/2017 09:21     Assessment/Plan: 71 year old female presenting with LE weakness that has been progressive over the past 2 months.  Patient with a history of back pain and peripheral neuropathy.  Now unable to ambulate.  LLE weaker than the RLE.  Despite history of PN reflexes intact.  Up going toes but no bowel or bladder incontinence.  MRI of the lumbar spine reviewed and shows nothing to explain current presentation.  Calcium normal.  Further work up recommended.  Recommendations: 1.  Myasthenia panel, B12, B1, RPR, TSH, folate, SPEP, CK, ESR, magnesium, heavy metal screen, serum coppoer 2.  Agree with further imaging of the remainder of the spine and brain.   3.  Will continue to follow with you.    Alexis Goodell, MD Neurology [phone] 03/28/2017, 10:26 AM

## 2017-03-28 NOTE — Progress Notes (Signed)
*  PRELIMINARY RESULTS* Echocardiogram 2D Echocardiogram has been performed.  Cristela BlueHege, Reg Bircher 03/28/2017, 3:45 PM

## 2017-03-28 NOTE — Care Management Obs Status (Signed)
MEDICARE OBSERVATION STATUS NOTIFICATION   Patient Details  Name: Alison HaradaSarah Duggan MRN: [id number] Date of Birth: 05-Jul-1946   Medicare Observation Status Notification Given:  Yes    [name], RN 03/28/2017, 2:24 PM

## 2017-03-28 NOTE — Progress Notes (Signed)
[employer] - [place] [facility]   PATIENT NAME: Alison HaradaSarah Torres    MR#:  [id number]  DATE OF BIRTH:  09/21/1945  SUBJECTIVE:  CHIEF COMPLAINT:   Chief Complaint  Patient presents with  . Gait Problem   Anxious. Family at bedside Still has LLE pain  REVIEW OF SYSTEMS:    Review of Systems  Constitutional: Negative for chills and fever.  HENT: Negative for sore throat.   Eyes: Negative for blurred vision, double vision and pain.  Respiratory: Negative for cough, hemoptysis, shortness of breath and wheezing.   Cardiovascular: Negative for chest pain, palpitations, orthopnea and leg swelling.  Gastrointestinal: Negative for abdominal pain, constipation, diarrhea, heartburn, nausea and vomiting.  Genitourinary: Negative for dysuria and hematuria.  Musculoskeletal: Positive for falls. Negative for back pain and joint pain.  Skin: Negative for rash.  Neurological: Positive for focal weakness and weakness. Negative for sensory change, speech change and headaches.  Endo/Heme/Allergies: Does not bruise/bleed easily.  Psychiatric/Behavioral: Negative for depression. The patient is not nervous/anxious.     DRUG ALLERGIES:   Allergies  Allergen Reactions  . Codeine   . Trazodone And Nefazodone     VITALS:  Blood pressure (!) 150/75, pulse 83, temperature 97.9 F (36.6 C), temperature source Oral, resp. rate 18, height 5\' 6"  (1.676 m), weight 82.7 kg (182 lb 4.8 oz), SpO2 99 %.  PHYSICAL EXAMINATION:   Physical Exam  GENERAL:  71 y.o.-year-old patient lying in the bed with no acute distress.  EYES: Pupils equal, round, reactive to light and accommodation. No scleral icterus. Extraocular muscles intact.  HEENT: Head atraumatic, normocephalic. Oropharynx and nasopharynx clear.  NECK:  Supple, no jugular venous distention. No thyroid enlargement, no tenderness.  LUNGS: Normal breath sounds bilaterally, no wheezing, rales, rhonchi. No use of accessory muscles of  respiration.  CARDIOVASCULAR: S1, S2 normal. No murmurs, rubs, or gallops.  ABDOMEN: Soft, nontender, nondistended. Bowel sounds present. No organomegaly or mass.  EXTREMITIES: No cyanosis, clubbing or edema b/l.    NEUROLOGIC: Cranial nerves II through XII are intact. LLE 3+/5 PSYCHIATRIC: The patient is alert and oriented x 3.  SKIN: No obvious rash, lesion, or ulcer.   LABORATORY PANEL:   CBC  Recent Labs Lab 03/28/17 0416  WBC 5.6  HGB 12.2  HCT 36.1  PLT 192   ------------------------------------------------------------------------------------------------------------------ Chemistries   Recent Labs Lab 03/27/17 1800 03/28/17 0416 03/28/17 1122  NA 142 142  --   K 3.4* 3.3*  --   CL 108 108  --   CO2 26 27  --   GLUCOSE 110* 115*  --   BUN 16 19  --   CREATININE 1.05* 1.07*  --   CALCIUM 9.4 8.9  --   MG  --   --  2.0  AST 27  --   --   ALT 22  --   --   ALKPHOS 72  --   --   BILITOT 0.6  --   --    ------------------------------------------------------------------------------------------------------------------  Cardiac Enzymes No results for input(s): TROPONINI in the last 168 hours. ------------------------------------------------------------------------------------------------------------------  RADIOLOGY:  Ct Head Wo Contrast  Result Date: 03/27/2017 CLINICAL DATA:  Subacute neuro deficits EXAM: CT HEAD WITHOUT CONTRAST TECHNIQUE: Contiguous axial images were obtained from the base of the skull through the vertex without intravenous contrast. COMPARISON:  None. FINDINGS: Brain: No evidence of acute infarction, hemorrhage, hydrocephalus, extra-axial collection or mass lesion/mass effect. Vascular: No hyperdense vessel or unexpected calcification. Skull: Negative Sinuses/Orbits:  Negative Other: None IMPRESSION: Negative CT head Electronically Signed   By: Marlan Palau M.D.   On: 03/27/2017 20:54   Mr Brain Wo Contrast  Result Date: 03/28/2017 CLINICAL  DATA:  Focal neuro deficits, greater than 6 hours, stroke suspect. Unable walk for 2 weeks. Frequent falls at home. Legs gave way. EXAM: MRI HEAD WITHOUT CONTRAST TECHNIQUE: Multiplanar, multiecho pulse sequences of the brain and surrounding structures were obtained without intravenous contrast. COMPARISON:  CT head without contrast 03/27/2017. FINDINGS: Brain: The diffusion-weighted images demonstrate no evidence for acute or subacute infarction. No acute hemorrhage or mass lesion is present. Minimal periventricular T2 changes bilaterally are within normal limits for age. The ventricles are of normal size. No significant extra-axial fluid collection is present. The internal auditory canals are normal bilaterally. The brainstem and cerebellum is normal. Vascular: Flow is present in the major intracranial arteries. Skull and upper cervical spine: Skullbase is normal. The craniocervical junction is normal. Midline sagittal structures are unremarkable. Marrow signal is normal. Sinuses/Orbits: The paranasal sinuses and mastoid air cells are clear. The globes and orbits are within normal limits. IMPRESSION: Negative MRI of the brain. Electronically Signed   By: Marin Roberts M.D.   On: 03/28/2017 13:11   Mr Lumbar Spine Wo Contrast  Result Date: 03/27/2017 CLINICAL DATA:  Initial evaluation for back pain, lower extremity weakness. EXAM: MRI LUMBAR SPINE WITHOUT CONTRAST TECHNIQUE: Multiplanar, multisequence MR imaging of the lumbar spine was performed. No intravenous contrast was administered. COMPARISON:  None available. FINDINGS: Segmentation: Study degraded by motion artifact. Normal segmentation. Lowest well-formed disc labeled the L5-S1 level. Alignment: Mild levoscoliosis. Vertebral bodies otherwise normally aligned with preservation of the normal lumbar lordosis. No listhesis. Vertebrae: Vertebral body heights are maintained. No evidence for acute or chronic fracture. Schmorl's node deformity noted  about the L3-4 and L4-5 interspaces. Signal intensity within the vertebral body bone marrow within normal limits. No worrisome osseous lesions. No abnormal marrow edema. Conus medullaris: Extends to the L1 level and appears normal. Paraspinal and other soft tissues: Paraspinous soft tissues within normal limits. Visualized visceral structures are normal. Disc levels: L1-2:  Minimal annular disc bulge.  No stenosis. L2-3:  Mild annular disc bulge.  No stenosis. L3-4: Diffuse degenerative disc bulge with intervertebral disc space narrowing and mild reactive endplate changes. No focal disc protrusion. Facet and ligamentum flavum hypertrophy. Mild spinal stenosis. No significant foraminal encroachment. L4-5: Mild diffuse disc bulge, eccentric to the right. Right far lateral reactive endplate changes. Right greater than left facet arthrosis. Small reactive effusions present within the bilateral L4-5 facets. No significant canal or lateral recess stenosis. Mild right L4 foraminal stenosis. No significant left foraminal encroachment. L5-S1:  Negative disc.  Mild facet hypertrophy.  No stenosis. IMPRESSION: 1. No acute abnormality within the lumbar spine. No significant canal stenosis or evidence for cord compression. 2. Mild degenerative disc disease and facet arthrosis at L3-4 and L4-5 as above without significant stenosis. Electronically Signed   By: Rise Mu M.D.   On: 03/27/2017 19:16   US Carotid Bilateral  Result Date: 03/28/2017 CLINICAL DATA:  71 year old female with a history of stroke. Cardiovascular risk factors include hyperlipidemia EXAM: BILATERAL CAROTID DUPLEX ULTRASOUND TECHNIQUE: Wallace Cullens scale imaging, color Doppler and duplex ultrasound were performed of bilateral carotid and vertebral arteries in the neck. COMPARISON:  No prior duplex FINDINGS: Criteria: Quantification of carotid stenosis is based on velocity parameters that correlate the residual internal carotid diameter with NASCET-based  stenosis levels, using the diameter of  the distal internal carotid lumen as the denominator for stenosis measurement. The following velocity measurements were obtained: RIGHT ICA:  Systolic 80 cm/sec, Diastolic 31 cm/sec CCA:  80 cm/sec SYSTOLIC ICA/CCA RATIO:  1.0 ECA:  83 cm/sec LEFT ICA:  Systolic 90 cm/sec, Diastolic 24 cm/sec CCA:  69 cm/sec SYSTOLIC ICA/CCA RATIO:  1.3 ECA:  79 cm/sec Right Brachial SBP: Not acquired Left Brachial SBP: Not acquired RIGHT CAROTID ARTERY: No significant calcifications of the right common carotid artery. Intermediate waveform maintained. Heterogeneous and partially calcified plaque at the right carotid bifurcation. No significant lumen shadowing. Low resistance waveform of the right ICA. Mild tortuosity RIGHT VERTEBRAL ARTERY: Antegrade flow with low resistance waveform. LEFT CAROTID ARTERY: No significant calcifications of the left common carotid artery. Intermediate waveform maintained. Heterogeneous and partially calcified plaque at the left carotid bifurcation without significant lumen shadowing. Low resistance waveform of the left ICA. Mild tortuosity LEFT VERTEBRAL ARTERY:  Antegrade flow with low resistance waveform. IMPRESSION: Color duplex indicates minimal heterogeneous and calcified plaque, with no hemodynamically significant stenosis by duplex criteria in the extracranial cerebrovascular circulation. Signed, Yvone Neu. Loreta Ave, DO [facility] Rad[facility] Electronically Signed   By: Gilmer Mor D.O.   On: 03/28/2017 09:21     ASSESSMENT AND PLAN:   71 year old female with past medical history of hypothyroidism, history of bowel obstruction, neuropathy, anxiety who presented to the hospital due to frequent falls, lower extremity weakness and difficulty walking.  1. LLE weakness  MRI brain negative Appreciate neurology input. MRI cervical and thoracic spine pending. Labs pending Will start B12 replacement    2. History of IBS-continue Bentyl  3. Hypothyroidism-continue Synthroid. TSH elevated. Increase dose -->  4. Hyperlipidemia-continue Zocor.  All the records are reviewed and case discussed with Care Management/Social Worker Management plans discussed with the patient, family and they are in agreement.  CODE STATUS: FULL CODE  DVT Prophylaxis: SCDs  TOTAL TIME TAKING CARE OF THIS PATIENT: 35 minutes.   POSSIBLE D/C IN 1-2 DAYS, DEPENDING ON CLINICAL CONDITION.  Milagros Loll R M.D on 03/28/2017 at 8:28 PM  Between 7am to 6pm - Pager - [phone]  After 6pm go to [email or web address] - password [email or web address]  [facility] [place] [facility]  Office  [phone]  CC: Primary care physician; [facility]  Note: This dictation was prepared with Dragon dictation along with smaller phrase technology. Any transcriptional errors that result from this process are unintentional.

## 2017-03-29 ENCOUNTER — Observation Stay: Payer: Medicare Other

## 2017-03-29 DIAGNOSIS — G959 Disease of spinal cord, unspecified: Secondary | ICD-10-CM

## 2017-03-29 DIAGNOSIS — G9520 Unspecified cord compression: Secondary | ICD-10-CM | POA: Diagnosis present

## 2017-03-29 LAB — BASIC METABOLIC PANEL
ANION GAP: 5 (ref 5–15)
BUN: 16 mg/dL (ref 6–20)
CALCIUM: 8.8 mg/dL — AB (ref 8.9–10.3)
CO2: 27 mmol/L (ref 22–32)
Chloride: 109 mmol/L (ref 101–111)
Creatinine, Ser: 0.95 mg/dL (ref 0.44–1.00)
GFR, EST NON AFRICAN AMERICAN: 58 mL/min — AB (ref >60)
GLUCOSE: 101 mg/dL — AB (ref 65–99)
POTASSIUM: 4.4 mmol/L (ref 3.5–5.1)
Sodium: 141 mmol/L (ref 135–145)

## 2017-03-29 LAB — ANTI-SMOOTH MUSCLE ANTIBODY, IGG: F-Actin IgG: 15 Units (ref 0–19)

## 2017-03-29 LAB — RPR: RPR Ser Ql: NONREACTIVE

## 2017-03-29 LAB — STRIATED MUSCLE ANTIBODY: Anti-striation Abs: NEGATIVE

## 2017-03-29 LAB — T3, FREE: T3 FREE: 2.1 pg/mL (ref 2.0–4.4)

## 2017-03-29 MED ORDER — GADOBENATE DIMEGLUMINE 529 MG/ML IV SOLN
20.0000 mL | Freq: Once | INTRAVENOUS | Status: AC | PRN
  Administered 2017-03-29: 17 mL via INTRAVENOUS

## 2017-03-29 MED ORDER — DEXAMETHASONE SODIUM PHOSPHATE 4 MG/ML IJ SOLN: 4.0000 mg | Freq: Three times a day (TID) | INTRAMUSCULAR | Status: DC

## 2017-03-29 MED ORDER — PANTOPRAZOLE SODIUM 40 MG IV SOLR
40.0000 mg | INTRAVENOUS | Status: DC
Start: 2017-03-29 — End: 2017-03-29
  Administered 2017-03-29: 40 mg via INTRAVENOUS
  Filled 2017-03-29: qty 40

## 2017-03-29 MED ORDER — DEXAMETHASONE SODIUM PHOSPHATE 10 MG/ML IJ SOLN
10.0000 mg | Freq: Once | INTRAMUSCULAR | Status: AC
  Administered 2017-03-29: 14:00:00 10 mg via INTRAVENOUS
  Filled 2017-03-29: qty 1

## 2017-03-29 NOTE — Discharge Summary (Signed)
[facility]   PATIENT NAME: Alison Torres    MR#:  [id number]  DATE OF BIRTH:  09-09-1945  DATE OF ADMISSION:  03/27/2017 ADMITTING PHYSICIAN: Houston Siren, MD  DATE OF DISCHARGE: 03/29/2017  PRIMARY CARE PHYSICIAN: [facility]   ADMISSION DIAGNOSIS:  Left leg weakness [R29.898]  DISCHARGE DIAGNOSIS:  Active Problems:   Compression of spinal cord (HCC)   SECONDARY DIAGNOSIS:   Past Medical History:  Diagnosis Date  . Anxiety   . Bowel obstruction (HCC)   . Depression   . Neuropathy   . Thyroid disease      ADMITTING HISTORY  HISTORY OF PRESENT ILLNESS:  Alison Torres  is a 71 y.o. female with a known history of Depression, previous history of bowel obstruction, neuropathy, hypothyroidism, anxiety who presents to the hospital due to difficulty walking and frequent falls. Patient says that she has not been able to walk now for the past 2 weeks and has been having frequent falls at home. She describes a feeling that she gets up and bears weight on her legs and her legs gave way and she falls. She has multiple bruises on lower extremities from her frequent falls. She presented to the ER for further evaluation. Patient underwent a CT scan of the head and MRI of the lumbar spine which showed no acute pathology. Tele-Neurology was consulted who recommended admission and further neurological evaluation and further testing. She denies any chest pain, shortness of breath, nausea, vomiting, abdominal pain, fever or chills cough or any other associated symptoms. Patient does complain of some numbness and tingling of her lower extremities which is chronic and attributed to some neuropathy.   HOSPITAL COURSE:   71 year old female with past medical history of hypothyroidism, history of bowel obstruction, neuropathy, anxiety who presented to the hospital due to frequent falls, lower extremity weakness and difficulty walking.  1. 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 Adriana Simas of neurosurgery. Accepted in transfer by Dr. Marcell Barlow at [facility]  She received aspirin on admission for possible CVA which has now been stopped.  2. History of IBS-continue Bentyl  3. Hypothyroidism-continue Synthroid. TSH elevated. Increased dose -->  4. Hyperlipidemia-continue Zocor.  B12 deficiency. Started on B12 injections  Patient has been stabilized and will be transferred to [facility] for further management of the T10 mass.   CONSULTS OBTAINED:  Treatment Team:  Thana Farr, MD Kym Groom, MD Lucy Chris, MD  DRUG ALLERGIES:   Allergies  Allergen Reactions  . Codeine   . Trazodone And Nefazodone     DISCHARGE MEDICATIONS:   Current Discharge Medication List    CONTINUE these medications which have NOT CHANGED   Details  dicyclomine (BENTYL) 10 MG capsule Take 10 mg by mouth 3 (three) times daily as needed for spasms.    ibuprofen (ADVIL,MOTRIN) 800 MG tablet Take 800 mg by mouth daily as needed.    levothyroxine (SYNTHROID, LEVOTHROID) 88 MCG tablet Take 88 mcg by mouth daily before breakfast.    loperamide (IMODIUM) 2 MG capsule Take by mouth 2 (two) times daily as needed for diarrhea or loose stools.    mirtazapine (REMERON) 30 MG tablet Take 30 mg by mouth at bedtime.    simvastatin (ZOCOR) 20 MG tablet Take 20 mg by mouth at bedtime.  Today   VITAL SIGNS:  Blood pressure (!) 149/79, pulse 97, temperature 98 F (36.7 C), temperature source Oral, resp. rate 20, height 5\' 6"  (1.676 m), weight 82.7 kg (182 lb 4.8 oz), SpO2 95 %.  I/O:   Intake/Output Summary (Last 24 hours) at 03/29/17 1335 Last data filed at 03/28/17 1354  Gross per 24 hour  Intake              240 ml  Output                 0 ml  Net              240 ml    PHYSICAL EXAMINATION:  Physical Exam  GENERAL:  71 y.o.-year-old patient lying in the bed with no acute distress.  LUNGS: Normal breath sounds bilaterally, no wheezing, rales,rhonchi or crepitation. No use of accessory muscles of respiration.  CARDIOVASCULAR: S1, S2 normal. No murmurs, rubs, or gallops.  ABDOMEN: Soft, non-tender, non-distended. Bowel sounds present. No organomegaly or mass.  NEUROLOGIC: Motor 3+/5 LLE. Proximal RLE 3+/5 PSYCHIATRIC: The patient is alert and oriented x 3.  SKIN: No obvious rash, lesion, or ulcer.   DATA REVIEW:   CBC  Recent Labs Lab 03/28/17 0416  WBC 5.6  HGB 12.2  HCT 36.1  PLT 192    Chemistries   Recent Labs Lab 03/27/17 1800  03/28/17 1122 03/29/17 0558  NA 142  < >  --  141  K 3.4*  < >  --  4.4  CL 108  < >  --  109  CO2 26  < >  --  27  GLUCOSE 110*  < >  --  101*  BUN 16  < >  --  16  CREATININE 1.05*  < >  --  0.95  CALCIUM 9.4  < >  --  8.8*  MG  --   --  2.0  --   AST 27  --   --   --   ALT 22  --   --   --   ALKPHOS 72  --   --   --   BILITOT 0.6  --   --   --   < > = values in this interval not displayed.  Cardiac Enzymes No results for input(s): TROPONINI in the last 168 hours.  Microbiology Results  No results found for this or any previous visit.  RADIOLOGY:  Ct Head Wo Contrast  Result Date: 03/27/2017 CLINICAL DATA:  Subacute neuro deficits EXAM: CT HEAD WITHOUT CONTRAST TECHNIQUE: Contiguous axial images were obtained from the base of the skull through the vertex without intravenous contrast. COMPARISON:  None. FINDINGS: Brain: No evidence of acute infarction, hemorrhage, hydrocephalus, extra-axial collection or mass lesion/mass effect. Vascular: No hyperdense vessel or unexpected calcification. Skull: Negative Sinuses/Orbits: Negative Other: None IMPRESSION: Negative CT head Electronically Signed   By: Marlan Palau M.D.   On: 03/27/2017 20:54   Mr Brain Wo  Contrast  Result Date: 03/28/2017 CLINICAL DATA:  Focal neuro deficits, greater than 6 hours, stroke suspect. Unable walk for 2 weeks. Frequent falls at home. Legs gave way. EXAM: MRI HEAD WITHOUT CONTRAST TECHNIQUE: Multiplanar, multiecho pulse sequences of the brain and surrounding structures were obtained without intravenous contrast. COMPARISON:  CT head without contrast 03/27/2017. FINDINGS: Brain: The diffusion-weighted images demonstrate no evidence for acute or subacute infarction. No acute hemorrhage or mass lesion is present. Minimal periventricular T2 changes bilaterally are within normal  limits for age. The ventricles are of normal size. No significant extra-axial fluid collection is present. The internal auditory canals are normal bilaterally. The brainstem and cerebellum is normal. Vascular: Flow is present in the major intracranial arteries. Skull and upper cervical spine: Skullbase is normal. The craniocervical junction is normal. Midline sagittal structures are unremarkable. Marrow signal is normal. Sinuses/Orbits: The paranasal sinuses and mastoid air cells are clear. The globes and orbits are within normal limits. IMPRESSION: Negative MRI of the brain. Electronically Signed   By: Marin Roberts M.D.   On: 03/28/2017 13:11   Mr Cervical Spine Wo Contrast  Result Date: 03/29/2017 CLINICAL DATA:  Difficulty walking. EXAM: MRI CERVICAL WITHOUT CONTRAST MRI THORACIC WITHOUT AND WITH CONTRAST TECHNIQUE: Multiplanar and multiecho pulse sequences of the thoracic spine were obtained without and with intravenous contrast. Multiplanar and multiecho pulse sequences of the cervical spine were obtained without contrast. CONTRAST:  Dose currently not available. COMPARISON:  None. FINDINGS: MRI CERVICAL SPINE FINDINGS Alignment:  Normal Vertebrae: No fracture, evidence of discitis, or bone lesion. Cord:  Normal signal and morphology. Paraspinal and other soft tissues: 6 mm cystic focus noted in the  lower right thyroid, below size threshold for strict imaging follow-up. Disc levels: C2-3: Mild facet spurring.  No herniation or impingement C3-4: Mild disc narrowing and desiccation. Mild facet spurring. No herniation or impinge C4-5: Moderate degenerative disc narrowing. No herniation or impingement C5-6: Moderate disc narrowing with mild endplate ridging. No herniation or impingement C6-7: Moderate disc narrowing with very mild ridging. No impingement C7-T1:No impingement. MRI THORACIC SPINE FINDINGS Alignment:  Normal Vertebrae: No fracture, evidence of discitis, or bone lesion. Attempted hemivertebra at T5. Cord and canal: There is a ventral and left eccentric intrathecal extra medullary mass measuring 24 by 13 x 16 mm. The mass fills the thecal sac with severe compression of the T2 hyperintense cord, displaced to the right. The mass has a broad contact with the dura which shows a dural tail type appearance. No foraminal exit or widening. No brain mass on recent MRI. No second mass lesion is seen. Meningioma is favored over nerve sheath tumor. No intrathecal mass on recent brain MRI to suggest drop lesion. No history of malignancy. Paraspinal and other soft tissues: Negative Disc levels: No significant degenerative changes. No degenerative impingement. A call is been placed to the ordering provider. IMPRESSION: 1. 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 2. Mild degenerative changes in the cervical spine without impingement. Electronically Signed   By: Marnee Spring M.D.   On: 03/29/2017 11:09   Mr Lumbar Spine Wo Contrast  Result Date: 03/27/2017 CLINICAL DATA:  Initial evaluation for back pain, lower extremity weakness. EXAM: MRI LUMBAR SPINE WITHOUT CONTRAST TECHNIQUE: Multiplanar, multisequence MR imaging of the lumbar spine was performed. No intravenous contrast was administered. COMPARISON:  None  available. FINDINGS: Segmentation: Study degraded by motion artifact. Normal segmentation. Lowest well-formed disc labeled the L5-S1 level. Alignment: Mild levoscoliosis. Vertebral bodies otherwise normally aligned with preservation of the normal lumbar lordosis. No listhesis. Vertebrae: Vertebral body heights are maintained. No evidence for acute or chronic fracture. Schmorl's node deformity noted about the L3-4 and L4-5 interspaces. Signal intensity within the vertebral body bone marrow within normal limits. No worrisome osseous lesions. No abnormal marrow edema. Conus medullaris: Extends to the L1 level and appears normal. Paraspinal and other soft tissues: Paraspinous soft tissues within normal  limits. Visualized visceral structures are normal. Disc levels: L1-2:  Minimal annular disc bulge.  No stenosis. L2-3:  Mild annular disc bulge.  No stenosis. L3-4: Diffuse degenerative disc bulge with intervertebral disc space narrowing and mild reactive endplate changes. No focal disc protrusion. Facet and ligamentum flavum hypertrophy. Mild spinal stenosis. No significant foraminal encroachment. L4-5: Mild diffuse disc bulge, eccentric to the right. Right far lateral reactive endplate changes. Right greater than left facet arthrosis. Small reactive effusions present within the bilateral L4-5 facets. No significant canal or lateral recess stenosis. Mild right L4 foraminal stenosis. No significant left foraminal encroachment. L5-S1:  Negative disc.  Mild facet hypertrophy.  No stenosis. IMPRESSION: 1. No acute abnormality within the lumbar spine. No significant canal stenosis or evidence for cord compression. 2. Mild degenerative disc disease and facet arthrosis at L3-4 and L4-5 as above without significant stenosis. Electronically Signed   By: Rise MuBenjamin  McClintock M.D.   On: 03/27/2017 19:16   Mr Thoracic Spine W Wo Contrast  Result Date: 03/29/2017 CLINICAL DATA:  Difficulty walking. EXAM: MRI CERVICAL WITHOUT  CONTRAST MRI THORACIC WITHOUT AND WITH CONTRAST TECHNIQUE: Multiplanar and multiecho pulse sequences of the thoracic spine were obtained without and with intravenous contrast. Multiplanar and multiecho pulse sequences of the cervical spine were obtained without contrast. CONTRAST:  Dose currently not available. COMPARISON:  None. FINDINGS: MRI CERVICAL SPINE FINDINGS Alignment:  Normal Vertebrae: No fracture, evidence of discitis, or bone lesion. Cord:  Normal signal and morphology. Paraspinal and other soft tissues: 6 mm cystic focus noted in the lower right thyroid, below size threshold for strict imaging follow-up. Disc levels: C2-3: Mild facet spurring.  No herniation or impingement C3-4: Mild disc narrowing and desiccation. Mild facet spurring. No herniation or impinge C4-5: Moderate degenerative disc narrowing. No herniation or impingement C5-6: Moderate disc narrowing with mild endplate ridging. No herniation or impingement C6-7: Moderate disc narrowing with very mild ridging. No impingement C7-T1:No impingement. MRI THORACIC SPINE FINDINGS Alignment:  Normal Vertebrae: No fracture, evidence of discitis, or bone lesion. Attempted hemivertebra at T5. Cord and canal: There is a ventral and left eccentric intrathecal extra medullary mass measuring 24 by 13 x 16 mm. The mass fills the thecal sac with severe compression of the T2 hyperintense cord, displaced to the right. The mass has a broad contact with the dura which shows a dural tail type appearance. No foraminal exit or widening. No brain mass on recent MRI. No second mass lesion is seen. Meningioma is favored over nerve sheath tumor. No intrathecal mass on recent brain MRI to suggest drop lesion. No history of malignancy. Paraspinal and other soft tissues: Negative Disc levels: No significant degenerative changes. No degenerative impingement. A call is been placed to the ordering provider. IMPRESSION: 1. 23 x 16 x 13 mm intrathecal extramedullary mass at  the level of T10 with severe compression of the T2 hyperintense cord. 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 2. Mild degenerative changes in the cervical spine without impingement. Electronically Signed   By: Marnee SpringJonathon  Watts M.D.   On: 03/29/2017 11:09   [place] Carotid Bilateral  Result Date: 03/28/2017 CLINICAL DATA:  71 year old female with a history of stroke. Cardiovascular risk factors include hyperlipidemia EXAM: BILATERAL CAROTID DUPLEX ULTRASOUND TECHNIQUE: Wallace CullensGray scale imaging, color Doppler and duplex ultrasound were performed of bilateral carotid and vertebral arteries in the neck. COMPARISON:  No prior duplex FINDINGS: Criteria: Quantification of carotid stenosis is based on velocity parameters that correlate the residual internal carotid diameter with NASCET-based stenosis levels,  using the diameter of the distal internal carotid lumen as the denominator for stenosis measurement. The following velocity measurements were obtained: RIGHT ICA:  Systolic 80 cm/sec, Diastolic 31 cm/sec CCA:  80 cm/sec SYSTOLIC ICA/CCA RATIO:  1.0 ECA:  83 cm/sec LEFT ICA:  Systolic 90 cm/sec, Diastolic 24 cm/sec CCA:  69 cm/sec SYSTOLIC ICA/CCA RATIO:  1.3 ECA:  79 cm/sec Right Brachial SBP: Not acquired Left Brachial SBP: Not acquired RIGHT CAROTID ARTERY: No significant calcifications of the right common carotid artery. Intermediate waveform maintained. Heterogeneous and partially calcified plaque at the right carotid bifurcation. No significant lumen shadowing. Low resistance waveform of the right ICA. Mild tortuosity RIGHT VERTEBRAL ARTERY: Antegrade flow with low resistance waveform. LEFT CAROTID ARTERY: No significant calcifications of the left common carotid artery. Intermediate waveform maintained. Heterogeneous and partially calcified plaque at the left carotid bifurcation without significant lumen shadowing. Low resistance waveform of the left ICA. Mild tortuosity LEFT VERTEBRAL ARTERY:  Antegrade flow with low resistance waveform.  IMPRESSION: Color duplex indicates minimal heterogeneous and calcified plaque, with no hemodynamically significant stenosis by duplex criteria in the extracranial cerebrovascular circulation. Signed, Yvone Neu. Loreta Ave, DO [facility] Electronically Signed   By: Gilmer Mor D.O.   On: 03/28/2017 09:21    Follow up with PCP in 1 week.  Management plans discussed with the patient, family and they are in agreement.  CODE STATUS:     Code Status Orders        Start     Ordered   03/27/17 2251  Full code  Continuous     03/27/17 2250    Code Status History    Date Active Date Inactive Code Status Order ID Comments User Context   This patient has a current code status but no historical code status.      TOTAL TIME TAKING CARE OF THIS PATIENT ON DAY OF DISCHARGE: more than 30 minutes.   Milagros Loll R M.D on 03/29/2017 at 1:35 PM  Between 7am to 6pm - Pager - [phone]  After 6pm go to [email or web address] - password [email or web address]  [facility]  Office  [phone]  CC: Primary care physician; [facility]  Note: This dictation was prepared with Dragon dictation along with smaller phrase technology. Any transcriptional errors that result from this process are unintentional.

## 2017-03-29 NOTE — Progress Notes (Signed)
Subjective: Patient reports that she is unchanged, continued lower extremity weakness.  Objective: Current vital signs: BP (!) 144/81 (BP Location: Left Arm)   Pulse 76   Temp (!) 97.4 F (36.3 C) (Oral)   Resp 20   Ht 5\' 6"  (1.676 m)   Wt 82.7 kg (182 lb 4.8 oz)   SpO2 99%   BMI 29.42 kg/m  Vital signs in last 24 hours: Temp:  [97.4 F (36.3 C)-97.9 F (36.6 C)] 97.4 F (36.3 C) (08/14 0554) Pulse Rate:  [76-106] 76 (08/14 0554) Resp:  [18-20] 20 (08/14 0554) BP: (110-158)/(66-90) 144/81 (08/14 1035) SpO2:  [92 %-99 %] 99 % (08/14 1035)  Intake/Output from previous day: 08/13 0701 - 08/14 0700 In: 240 [P.O.:240] Out: 200 [Urine:200] Intake/Output this shift: No intake/output data recorded. Nutritional status: Diet regular Room service appropriate? Yes; Fluid consistency: Thin  Neurologic Exam: Mental Status: Alert, oriented, thought content appropriate.  Speech fluent without evidence of aphasia.  Able to follow 3 step commands without difficulty. Cranial Nerves: II: Discs flat bilaterally; Visual fields grossly normal, pupils equal, round, reactive to light and accommodation III,IV, VI: ptosis not present, extra-ocular motions intact bilaterally V,VII: smile symmetric, facial light touch sensation normal bilaterally VIII: hearing normal bilaterally IX,X: gag reflex present XI: bilateral shoulder shrug XII: midline tongue extension Motor: Right :  Upper extremity   5/5                                      Left:     Upper extremity   5/5 The right lower extremity with hip flexor weakness at 3/5.  Patient 5/5 otherwise in the RLE.  3-3+/5 throughout in the LLE.   Sensory: Pinprick and light touch decreased to just above the knees in both lower extremities Deep Tendon Reflexes: 2+ and symmetric throughout Plantars: Right: upgoing                         Left: upgoing   Lab Results: Basic Metabolic Panel:  Recent Labs Lab 03/27/17 1800 03/28/17 0416  03/28/17 1122 03/29/17 0558  NA 142 142  --  141  K 3.4* 3.3*  --  4.4  CL 108 108  --  109  CO2 26 27  --  27  GLUCOSE 110* 115*  --  101*  BUN 16 19  --  16  CREATININE 1.05* 1.07*  --  0.95  CALCIUM 9.4 8.9  --  8.8*  MG  --   --  2.0  --     Liver Function Tests:  Recent Labs Lab 03/27/17 1800  AST 27  ALT 22  ALKPHOS 72  BILITOT 0.6  PROT 7.1  ALBUMIN 4.3   No results for input(s): LIPASE, AMYLASE in the last 168 hours. No results for input(s): AMMONIA in the last 168 hours.  CBC:  Recent Labs Lab 03/27/17 1800 03/28/17 0416  WBC 6.5 5.6  HGB 13.5 12.2  HCT 40.2 36.1  MCV 85.5 85.3  PLT 215 192    Cardiac Enzymes:  Recent Labs Lab 03/28/17 1122  CKMB 2.0    Lipid Panel: No results for input(s): CHOL, TRIG, HDL, CHOLHDL, VLDL, LDLCALC in the last 168 hours.  CBG: No results for input(s): GLUCAP in the last 168 hours.  Microbiology: No results found for this or any previous visit.  Coagulation Studies: No results for  input(s): LABPROT, INR in the last 72 hours.  Imaging: Ct Head Wo Contrast  Result Date: 03/27/2017 CLINICAL DATA:  Subacute neuro deficits EXAM: CT HEAD WITHOUT CONTRAST TECHNIQUE: Contiguous axial images were obtained from the base of the skull through the vertex without intravenous contrast. COMPARISON:  None. FINDINGS: Brain: No evidence of acute infarction, hemorrhage, hydrocephalus, extra-axial collection or mass lesion/mass effect. Vascular: No hyperdense vessel or unexpected calcification. Skull: Negative Sinuses/Orbits: Negative Other: None IMPRESSION: Negative CT head Electronically Signed   By: Marlan Palau M.D.   On: 03/27/2017 20:54   Mr Brain Wo Contrast  Result Date: 03/28/2017 CLINICAL DATA:  Focal neuro deficits, greater than 6 hours, stroke suspect. Unable walk for 2 weeks. Frequent falls at home. Legs gave way. EXAM: MRI HEAD WITHOUT CONTRAST TECHNIQUE: Multiplanar, multiecho pulse sequences of the brain and  surrounding structures were obtained without intravenous contrast. COMPARISON:  CT head without contrast 03/27/2017. FINDINGS: Brain: The diffusion-weighted images demonstrate no evidence for acute or subacute infarction. No acute hemorrhage or mass lesion is present. Minimal periventricular T2 changes bilaterally are within normal limits for age. The ventricles are of normal size. No significant extra-axial fluid collection is present. The internal auditory canals are normal bilaterally. The brainstem and cerebellum is normal. Vascular: Flow is present in the major intracranial arteries. Skull and upper cervical spine: Skullbase is normal. The craniocervical junction is normal. Midline sagittal structures are unremarkable. Marrow signal is normal. Sinuses/Orbits: The paranasal sinuses and mastoid air cells are clear. The globes and orbits are within normal limits. IMPRESSION: Negative MRI of the brain. Electronically Signed   By: Marin Roberts M.D.   On: 03/28/2017 13:11   Mr Cervical Spine Wo Contrast  Result Date: 03/29/2017 CLINICAL DATA:  Difficulty walking. EXAM: MRI CERVICAL WITHOUT CONTRAST MRI THORACIC WITHOUT AND WITH CONTRAST TECHNIQUE: Multiplanar and multiecho pulse sequences of the thoracic spine were obtained without and with intravenous contrast. Multiplanar and multiecho pulse sequences of the cervical spine were obtained without contrast. CONTRAST:  Dose currently not available. COMPARISON:  None. FINDINGS: MRI CERVICAL SPINE FINDINGS Alignment:  Normal Vertebrae: No fracture, evidence of discitis, or bone lesion. Cord:  Normal signal and morphology. Paraspinal and other soft tissues: 6 mm cystic focus noted in the lower right thyroid, below size threshold for strict imaging follow-up. Disc levels: C2-3: Mild facet spurring.  No herniation or impingement C3-4: Mild disc narrowing and desiccation. Mild facet spurring. No herniation or impinge C4-5: Moderate degenerative disc narrowing. No  herniation or impingement C5-6: Moderate disc narrowing with mild endplate ridging. No herniation or impingement C6-7: Moderate disc narrowing with very mild ridging. No impingement C7-T1:No impingement. MRI THORACIC SPINE FINDINGS Alignment:  Normal Vertebrae: No fracture, evidence of discitis, or bone lesion. Attempted hemivertebra at T5. Cord and canal: There is a ventral and left eccentric intrathecal extra medullary mass measuring 24 by 13 x 16 mm. The mass fills the thecal sac with severe compression of the T2 hyperintense cord, displaced to the right. The mass has a broad contact with the dura which shows a dural tail type appearance. No foraminal exit or widening. No brain mass on recent MRI. No second mass lesion is seen. Meningioma is favored over nerve sheath tumor. No intrathecal mass on recent brain MRI to suggest drop lesion. No history of malignancy. Paraspinal and other soft tissues: Negative Disc levels: No significant degenerative changes. No degenerative impingement. A call is been placed to the ordering provider. IMPRESSION: 1. 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 2. Mild degenerative changes in the cervical spine without impingement. Electronically Signed   By: Marnee Spring M.D.   On: 03/29/2017 11:09   Mr Lumbar Spine Wo Contrast  Result Date: 03/27/2017 CLINICAL DATA:  Initial evaluation for back pain, lower extremity weakness. EXAM: MRI LUMBAR SPINE WITHOUT CONTRAST TECHNIQUE: Multiplanar, multisequence MR imaging of the lumbar spine was performed. No intravenous contrast was administered. COMPARISON:  None available. FINDINGS: Segmentation: Study degraded by motion artifact. Normal segmentation. Lowest well-formed disc labeled the L5-S1 level. Alignment: Mild levoscoliosis. Vertebral bodies otherwise normally aligned with preservation of the normal lumbar lordosis. No listhesis.  Vertebrae: Vertebral body heights are maintained. No evidence for acute or chronic fracture. Schmorl's node deformity noted about the L3-4 and L4-5 interspaces. Signal intensity within the vertebral body bone marrow within normal limits. No worrisome osseous lesions. No abnormal marrow edema. Conus medullaris: Extends to the L1 level and appears normal. Paraspinal and other soft tissues: Paraspinous soft tissues within normal limits. Visualized visceral structures are normal. Disc levels: L1-2:  Minimal annular disc bulge.  No stenosis. L2-3:  Mild annular disc bulge.  No stenosis. L3-4: Diffuse degenerative disc bulge with intervertebral disc space narrowing and mild reactive endplate changes. No focal disc protrusion. Facet and ligamentum flavum hypertrophy. Mild spinal stenosis. No significant foraminal encroachment. L4-5: Mild diffuse disc bulge, eccentric to the right. Right far lateral reactive endplate changes. Right greater than left facet arthrosis. Small reactive effusions present within the bilateral L4-5 facets. No significant canal or lateral recess stenosis. Mild right L4 foraminal stenosis. No significant left foraminal encroachment. L5-S1:  Negative disc.  Mild facet hypertrophy.  No stenosis. IMPRESSION: 1. No acute abnormality within the lumbar spine. No significant canal stenosis or evidence for cord compression. 2. Mild degenerative disc disease and facet arthrosis at L3-4 and L4-5 as above without significant stenosis. Electronically Signed   By: Rise Mu M.D.   On: 03/27/2017 19:16   Mr Thoracic Spine W Wo Contrast  Result Date: 03/29/2017 CLINICAL DATA:  Difficulty walking. EXAM: MRI CERVICAL WITHOUT CONTRAST MRI THORACIC WITHOUT AND WITH CONTRAST TECHNIQUE: Multiplanar and multiecho pulse sequences of the thoracic spine were obtained without and with intravenous contrast. Multiplanar and multiecho pulse sequences of the cervical spine were obtained without contrast. CONTRAST:   Dose currently not available. COMPARISON:  None. FINDINGS: MRI CERVICAL SPINE FINDINGS Alignment:  Normal Vertebrae: No fracture, evidence of discitis, or bone lesion. Cord:  Normal signal and morphology. Paraspinal and other soft tissues: 6 mm cystic focus noted in the lower right thyroid, below size threshold for strict imaging follow-up. Disc levels: C2-3: Mild facet spurring.  No herniation or impingement C3-4: Mild disc narrowing and desiccation. Mild facet spurring. No herniation or impinge C4-5: Moderate degenerative disc narrowing. No herniation or impingement C5-6: Moderate disc narrowing with mild endplate ridging. No herniation or impingement C6-7: Moderate disc narrowing with very mild ridging. No impingement C7-T1:No impingement. MRI THORACIC SPINE FINDINGS Alignment:  Normal Vertebrae: No fracture, evidence of discitis, or bone lesion. Attempted hemivertebra at T5. Cord and canal: There is a ventral and left eccentric intrathecal extra medullary mass measuring 24 by 13 x 16 mm. The mass fills the thecal sac with severe compression of the T2 hyperintense cord, displaced to the right. The mass has a broad contact with the dura which shows a dural tail type appearance. No foraminal exit  or widening. No brain mass on recent MRI. No second mass lesion is seen. Meningioma is favored over nerve sheath tumor. No intrathecal mass on recent brain MRI to suggest drop lesion. No history of malignancy. Paraspinal and other soft tissues: Negative Disc levels: No significant degenerative changes. No degenerative impingement. A call is been placed to the ordering provider. IMPRESSION: 1. 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 2. Mild degenerative changes in the cervical spine without impingement. Electronically Signed   By: Marnee SpringJonathon  Watts M.D.   On: 03/29/2017 11:09   [place] Carotid Bilateral  Result Date:  03/28/2017 CLINICAL DATA:  71 year old female with a history of stroke. Cardiovascular risk factors include hyperlipidemia EXAM: BILATERAL CAROTID DUPLEX ULTRASOUND TECHNIQUE: Wallace CullensGray scale imaging, color Doppler and duplex ultrasound were performed of bilateral carotid and vertebral arteries in the neck. COMPARISON:  No prior duplex FINDINGS: Criteria: Quantification of carotid stenosis is based on velocity parameters that correlate the residual internal carotid diameter with NASCET-based stenosis levels, using the diameter of the distal internal carotid lumen as the denominator for stenosis measurement. The following velocity measurements were obtained: RIGHT ICA:  Systolic 80 cm/sec, Diastolic 31 cm/sec CCA:  80 cm/sec SYSTOLIC ICA/CCA RATIO:  1.0 ECA:  83 cm/sec LEFT ICA:  Systolic 90 cm/sec, Diastolic 24 cm/sec CCA:  69 cm/sec SYSTOLIC ICA/CCA RATIO:  1.3 ECA:  79 cm/sec Right Brachial SBP: Not acquired Left Brachial SBP: Not acquired RIGHT CAROTID ARTERY: No significant calcifications of the right common carotid artery. Intermediate waveform maintained. Heterogeneous and partially calcified plaque at the right carotid bifurcation. No significant lumen shadowing. Low resistance waveform of the right ICA. Mild tortuosity RIGHT VERTEBRAL ARTERY: Antegrade flow with low resistance waveform. LEFT CAROTID ARTERY: No significant calcifications of the left common carotid artery. Intermediate waveform maintained. Heterogeneous and partially calcified plaque at the left carotid bifurcation without significant lumen shadowing. Low resistance waveform of the left ICA. Mild tortuosity LEFT VERTEBRAL ARTERY:  Antegrade flow with low resistance waveform. IMPRESSION: Color duplex indicates minimal heterogeneous and calcified plaque, with no hemodynamically significant stenosis by duplex criteria in the extracranial cerebrovascular circulation. Signed, Yvone NeuJaime S. Loreta AveWagner, DO [facility]  [facility] Electronically Signed   By: Gilmer MorJaime  Wagner D.O.   On: 03/28/2017 09:21    Medications:  I have reviewed the patient's current medications. Scheduled: . aspirin EC  81 mg Oral Daily  . cyanocobalamin  1,000 mcg Subcutaneous Daily  . enoxaparin (LOVENOX) injection  40 mg Subcutaneous Q24H  . levothyroxine  100 mcg Oral QAC breakfast  . mirtazapine  30 mg Oral QHS  . simvastatin  20 mg Oral QHS    Assessment/Plan: Patient unchanged.  MRI of the brain reviewed and shows no acute changes.  MRI of the thoracic spine shows an extradural mass lesion at T10 with associated cord compression and T2 hyperintensity.  Although meningioma felt most likely further characterization is necessary.  Case discussed with Dr. Elpidio AnisSudini and neurosurgery is being made aware.    Recommendations: 1.  Decadron 10mg  IV now then 4mg  q 8 2.  GI prophylaxis     LOS: 0 days   Thana FarrLeslie Arville Postlewaite, MD Neurology [phone] 03/29/2017  11:52 AM

## 2017-03-29 NOTE — Progress Notes (Signed)
PT Cancellation Note  Patient Details Name: Anthoney HaradaSarah Zawislak MRN: [id number] DOB: [birth year]   Cancelled Treatment:    Reason Eval/Treat Not Completed: Other (comment). Pt currently out of room for imaging. Will re-attempt at another time.   Sarha Bartelt 03/29/2017, 9:50 AM Elizabeth PalauStephanie Devan Babino, PT, DPT [phone]

## 2017-03-29 NOTE — Progress Notes (Signed)
[facility] [phone]. Patient going to room 7111. Report given and all questions answered. [facility] transport unavailable. [facility] [phone] called and will arrive between 5:15 and 5:30.

## 2017-03-29 NOTE — Progress Notes (Signed)
Report to BLS transport crew. All questions answered. Moved to gurney without difficulty. Patient tolerated well. Patient alert and oriented, in no distress. [facility].

## 2017-03-29 NOTE — Progress Notes (Signed)
[facility] - [place] [facility]   PATIENT NAME: Alison HaradaSarah Torres    MR#:  [id number]  DATE OF BIRTH:  10/07/1945  SUBJECTIVE:  CHIEF COMPLAINT:   Chief Complaint  Patient presents with  . Gait Problem   Still has weakness lower extremities. Has chronic incontinence to stool  REVIEW OF SYSTEMS:    Review of Systems  Constitutional: Negative for chills and fever.  HENT: Negative for sore throat.   Eyes: Negative for blurred vision, double vision and pain.  Respiratory: Negative for cough, hemoptysis, shortness of breath and wheezing.   Cardiovascular: Negative for chest pain, palpitations, orthopnea and leg swelling.  Gastrointestinal: Negative for abdominal pain, constipation, diarrhea, heartburn, nausea and vomiting.  Genitourinary: Negative for dysuria and hematuria.  Musculoskeletal: Positive for falls. Negative for back pain and joint pain.  Skin: Negative for rash.  Neurological: Positive for focal weakness and weakness. Negative for sensory change, speech change and headaches.  Endo/Heme/Allergies: Does not bruise/bleed easily.  Psychiatric/Behavioral: Negative for depression. The patient is not nervous/anxious.     DRUG ALLERGIES:   Allergies  Allergen Reactions  . Codeine   . Trazodone And Nefazodone     VITALS:  Blood pressure (!) 144/81, pulse 76, temperature (!) 97.4 F (36.3 C), temperature source Oral, resp. rate 20, height 5\' 6"  (1.676 m), weight 82.7 kg (182 lb 4.8 oz), SpO2 99 %.  PHYSICAL EXAMINATION:   Physical Exam  GENERAL:  71 y.o.-year-old patient lying in the bed with no acute distress.  EYES: Pupils equal, round, reactive to light and accommodation. No scleral icterus. Extraocular muscles intact.  HEENT: Head atraumatic, normocephalic. Oropharynx and nasopharynx clear.  NECK:  Supple, no jugular venous distention. No thyroid enlargement, no tenderness.  LUNGS: Normal breath sounds bilaterally, no wheezing, rales, rhonchi.  No use of accessory muscles of respiration.  CARDIOVASCULAR: S1, S2 normal. No murmurs, rubs, or gallops.  ABDOMEN: Soft, nontender, nondistended. Bowel sounds present. No organomegaly or mass.  EXTREMITIES: No cyanosis, clubbing or edema b/l.    NEUROLOGIC: Cranial nerves II through XII are intact. LLE 3+/5 PSYCHIATRIC: The patient is alert and oriented x 3.  SKIN: No obvious rash, lesion, or ulcer.   LABORATORY PANEL:   CBC  Recent Labs Lab 03/28/17 0416  WBC 5.6  HGB 12.2  HCT 36.1  PLT 192   ------------------------------------------------------------------------------------------------------------------ Chemistries   Recent Labs Lab 03/27/17 1800  03/28/17 1122 03/29/17 0558  NA 142  < >  --  141  K 3.4*  < >  --  4.4  CL 108  < >  --  109  CO2 26  < >  --  27  GLUCOSE 110*  < >  --  101*  BUN 16  < >  --  16  CREATININE 1.05*  < >  --  0.95  CALCIUM 9.4  < >  --  8.8*  MG  --   --  2.0  --   AST 27  --   --   --   ALT 22  --   --   --   ALKPHOS 72  --   --   --   BILITOT 0.6  --   --   --   < > = values in this interval not displayed. ------------------------------------------------------------------------------------------------------------------  Cardiac Enzymes No results for input(s): TROPONINI in the last 168 hours. ------------------------------------------------------------------------------------------------------------------  RADIOLOGY:  Ct Head Wo Contrast  Result Date: 03/27/2017 CLINICAL DATA:  Subacute neuro deficits  EXAM: CT HEAD WITHOUT CONTRAST TECHNIQUE: Contiguous axial images were obtained from the base of the skull through the vertex without intravenous contrast. COMPARISON:  None. FINDINGS: Brain: No evidence of acute infarction, hemorrhage, hydrocephalus, extra-axial collection or mass lesion/mass effect. Vascular: No hyperdense vessel or unexpected calcification. Skull: Negative Sinuses/Orbits: Negative Other: None IMPRESSION: Negative  CT head Electronically Signed   By: Marlan Palau M.D.   On: 03/27/2017 20:54   Mr Brain Wo Contrast  Result Date: 03/28/2017 CLINICAL DATA:  Focal neuro deficits, greater than 6 hours, stroke suspect. Unable walk for 2 weeks. Frequent falls at home. Legs gave way. EXAM: MRI HEAD WITHOUT CONTRAST TECHNIQUE: Multiplanar, multiecho pulse sequences of the brain and surrounding structures were obtained without intravenous contrast. COMPARISON:  CT head without contrast 03/27/2017. FINDINGS: Brain: The diffusion-weighted images demonstrate no evidence for acute or subacute infarction. No acute hemorrhage or mass lesion is present. Minimal periventricular T2 changes bilaterally are within normal limits for age. The ventricles are of normal size. No significant extra-axial fluid collection is present. The internal auditory canals are normal bilaterally. The brainstem and cerebellum is normal. Vascular: Flow is present in the major intracranial arteries. Skull and upper cervical spine: Skullbase is normal. The craniocervical junction is normal. Midline sagittal structures are unremarkable. Marrow signal is normal. Sinuses/Orbits: The paranasal sinuses and mastoid air cells are clear. The globes and orbits are within normal limits. IMPRESSION: Negative MRI of the brain. Electronically Signed   By: Marin Roberts M.D.   On: 03/28/2017 13:11   Mr Cervical Spine Wo Contrast  Result Date: 03/29/2017 CLINICAL DATA:  Difficulty walking. EXAM: MRI CERVICAL WITHOUT CONTRAST MRI THORACIC WITHOUT AND WITH CONTRAST TECHNIQUE: Multiplanar and multiecho pulse sequences of the thoracic spine were obtained without and with intravenous contrast. Multiplanar and multiecho pulse sequences of the cervical spine were obtained without contrast. CONTRAST:  Dose currently not available. COMPARISON:  None. FINDINGS: MRI CERVICAL SPINE FINDINGS Alignment:  Normal Vertebrae: No fracture, evidence of discitis, or bone lesion. Cord:   Normal signal and morphology. Paraspinal and other soft tissues: 6 mm cystic focus noted in the lower right thyroid, below size threshold for strict imaging follow-up. Disc levels: C2-3: Mild facet spurring.  No herniation or impingement C3-4: Mild disc narrowing and desiccation. Mild facet spurring. No herniation or impinge C4-5: Moderate degenerative disc narrowing. No herniation or impingement C5-6: Moderate disc narrowing with mild endplate ridging. No herniation or impingement C6-7: Moderate disc narrowing with very mild ridging. No impingement C7-T1:No impingement. MRI THORACIC SPINE FINDINGS Alignment:  Normal Vertebrae: No fracture, evidence of discitis, or bone lesion. Attempted hemivertebra at T5. Cord and canal: There is a ventral and left eccentric intrathecal extra medullary mass measuring 24 by 13 x 16 mm. The mass fills the thecal sac with severe compression of the T2 hyperintense cord, displaced to the right. The mass has a broad contact with the dura which shows a dural tail type appearance. No foraminal exit or widening. No brain mass on recent MRI. No second mass lesion is seen. Meningioma is favored over nerve sheath tumor. No intrathecal mass on recent brain MRI to suggest drop lesion. No history of malignancy. Paraspinal and other soft tissues: Negative Disc levels: No significant degenerative changes. No degenerative impingement. A call is been placed to the ordering provider. IMPRESSION: 1. 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 2. Mild degenerative changes  in the cervical spine without impingement. Electronically Signed   By: Marnee Spring M.D.   On: 03/29/2017 11:09   Mr Lumbar Spine Wo Contrast  Result Date: 03/27/2017 CLINICAL DATA:  Initial evaluation for back pain, lower extremity weakness. EXAM: MRI LUMBAR SPINE WITHOUT CONTRAST TECHNIQUE: Multiplanar, multisequence MR imaging  of the lumbar spine was performed. No intravenous contrast was administered. COMPARISON:  None available. FINDINGS: Segmentation: Study degraded by motion artifact. Normal segmentation. Lowest well-formed disc labeled the L5-S1 level. Alignment: Mild levoscoliosis. Vertebral bodies otherwise normally aligned with preservation of the normal lumbar lordosis. No listhesis. Vertebrae: Vertebral body heights are maintained. No evidence for acute or chronic fracture. Schmorl's node deformity noted about the L3-4 and L4-5 interspaces. Signal intensity within the vertebral body bone marrow within normal limits. No worrisome osseous lesions. No abnormal marrow edema. Conus medullaris: Extends to the L1 level and appears normal. Paraspinal and other soft tissues: Paraspinous soft tissues within normal limits. Visualized visceral structures are normal. Disc levels: L1-2:  Minimal annular disc bulge.  No stenosis. L2-3:  Mild annular disc bulge.  No stenosis. L3-4: Diffuse degenerative disc bulge with intervertebral disc space narrowing and mild reactive endplate changes. No focal disc protrusion. Facet and ligamentum flavum hypertrophy. Mild spinal stenosis. No significant foraminal encroachment. L4-5: Mild diffuse disc bulge, eccentric to the right. Right far lateral reactive endplate changes. Right greater than left facet arthrosis. Small reactive effusions present within the bilateral L4-5 facets. No significant canal or lateral recess stenosis. Mild right L4 foraminal stenosis. No significant left foraminal encroachment. L5-S1:  Negative disc.  Mild facet hypertrophy.  No stenosis. IMPRESSION: 1. No acute abnormality within the lumbar spine. No significant canal stenosis or evidence for cord compression. 2. Mild degenerative disc disease and facet arthrosis at L3-4 and L4-5 as above without significant stenosis. Electronically Signed   By: Rise Mu M.D.   On: 03/27/2017 19:16   Mr Thoracic Spine W Wo  Contrast  Result Date: 03/29/2017 CLINICAL DATA:  Difficulty walking. EXAM: MRI CERVICAL WITHOUT CONTRAST MRI THORACIC WITHOUT AND WITH CONTRAST TECHNIQUE: Multiplanar and multiecho pulse sequences of the thoracic spine were obtained without and with intravenous contrast. Multiplanar and multiecho pulse sequences of the cervical spine were obtained without contrast. CONTRAST:  Dose currently not available. COMPARISON:  None. FINDINGS: MRI CERVICAL SPINE FINDINGS Alignment:  Normal Vertebrae: No fracture, evidence of discitis, or bone lesion. Cord:  Normal signal and morphology. Paraspinal and other soft tissues: 6 mm cystic focus noted in the lower right thyroid, below size threshold for strict imaging follow-up. Disc levels: C2-3: Mild facet spurring.  No herniation or impingement C3-4: Mild disc narrowing and desiccation. Mild facet spurring. No herniation or impinge C4-5: Moderate degenerative disc narrowing. No herniation or impingement C5-6: Moderate disc narrowing with mild endplate ridging. No herniation or impingement C6-7: Moderate disc narrowing with very mild ridging. No impingement C7-T1:No impingement. MRI THORACIC SPINE FINDINGS Alignment:  Normal Vertebrae: No fracture, evidence of discitis, or bone lesion. Attempted hemivertebra at T5. Cord and canal: There is a ventral and left eccentric intrathecal extra medullary mass measuring 24 by 13 x 16 mm. The mass fills the thecal sac with severe compression of the T2 hyperintense cord, displaced to the right. The mass has a broad contact with the dura which shows a dural tail type appearance. No foraminal exit or widening. No brain mass on recent MRI. No second mass lesion is seen. Meningioma is favored over nerve sheath tumor. No intrathecal mass  on recent brain MRI to suggest drop lesion. No history of malignancy. Paraspinal and other soft tissues: Negative Disc levels: No significant degenerative changes. No degenerative impingement. A call is been  placed to the ordering provider. IMPRESSION: 1. 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 2. Mild degenerative changes in the cervical spine without impingement. Electronically Signed   By: Marnee Spring M.D.   On: 03/29/2017 11:09   US Carotid Bilateral  Result Date: 03/28/2017 CLINICAL DATA:  71 year old female with a history of stroke. Cardiovascular risk factors include hyperlipidemia EXAM: BILATERAL CAROTID DUPLEX ULTRASOUND TECHNIQUE: Wallace Cullens scale imaging, color Doppler and duplex ultrasound were performed of bilateral carotid and vertebral arteries in the neck. COMPARISON:  No prior duplex FINDINGS: Criteria: Quantification of carotid stenosis is based on velocity parameters that correlate the residual internal carotid diameter with NASCET-based stenosis levels, using the diameter of the distal internal carotid lumen as the denominator for stenosis measurement. The following velocity measurements were obtained: RIGHT ICA:  Systolic 80 cm/sec, Diastolic 31 cm/sec CCA:  80 cm/sec SYSTOLIC ICA/CCA RATIO:  1.0 ECA:  83 cm/sec LEFT ICA:  Systolic 90 cm/sec, Diastolic 24 cm/sec CCA:  69 cm/sec SYSTOLIC ICA/CCA RATIO:  1.3 ECA:  79 cm/sec Right Brachial SBP: Not acquired Left Brachial SBP: Not acquired RIGHT CAROTID ARTERY: No significant calcifications of the right common carotid artery. Intermediate waveform maintained. Heterogeneous and partially calcified plaque at the right carotid bifurcation. No significant lumen shadowing. Low resistance waveform of the right ICA. Mild tortuosity RIGHT VERTEBRAL ARTERY: Antegrade flow with low resistance waveform. LEFT CAROTID ARTERY: No significant calcifications of the left common carotid artery. Intermediate waveform maintained. Heterogeneous and partially calcified plaque at the left carotid bifurcation without significant lumen shadowing. Low resistance waveform of  the left ICA. Mild tortuosity LEFT VERTEBRAL ARTERY:  Antegrade flow with low resistance waveform. IMPRESSION: Color duplex indicates minimal heterogeneous and calcified plaque, with no hemodynamically significant stenosis by duplex criteria in the extracranial cerebrovascular circulation. Signed, Yvone Neu. Loreta Ave, DO [facility] Radiology Electronically Signed   By: Gilmer Mor D.O.   On: 03/28/2017 09:21     ASSESSMENT AND PLAN:   71 year old female with past medical history of hypothyroidism, history of bowel obstruction, neuropathy, anxiety who presented to the hospital due to frequent falls, lower extremity weakness and difficulty walking.  1. LLE weakness due to T10 mass MRI brain negative Appreciate neurology input. MRI  thoracic spine showed T10 mass with cord compression started B12 replacement  Decadron IV Discussed with Dr. Adriana Simas of neurosurgery  2. History of IBS-continue Bentyl  3. Hypothyroidism-continue Synthroid. TSH elevated. Increased dose -->  4. Hyperlipidemia-continue Zocor.  All the records are reviewed and case discussed with Care Management/Social Worker Management plans discussed with the patient, family and they are in agreement.  CODE STATUS: FULL CODE  DVT Prophylaxis: SCDs  TOTAL TIME TAKING CARE OF THIS PATIENT: 35 minutes.   POSSIBLE D/C IN 1-2 DAYS, DEPENDING ON CLINICAL CONDITION.  Milagros Loll R M.D on 03/29/2017 at 12:43 PM  Between 7am to 6pm - Pager - [phone]  After 6pm go to [email or web address] - password [email or web address]  [facility]  Office  [phone]  CC: Primary care physician; [facility]  Note: This dictation was prepared with Dragon dictation along with smaller phrase technology. Any transcriptional errors that result from this process  are unintentional.

## 2017-03-29 NOTE — Consult Note (Signed)
Neurosurgery-New Consultation Evaluation 03/29/2017 Alison Torres [id number]  Identifying Statement: Alison Torres is a 71 y.o. female [place] [id number] with left leg weakness  Physician Requesting Consultation: [facility] Inpatient medicine  History of Present Illness: Alison Torres is admitted with new left leg weakness. She states she has had some difficulty walking for the past two months but the left leg got weaker more recently. She does feel like the right leg is weaker also. She also feels like her legs are numb. She denies any pain shooting into her legs but she is having lots of back pain, especially with movement. She presented to ED and given concern for stroke, she was sent for brain imaging and given ASA. This was negative but MRI of the thoracic spine was performed that showed a mass.   She denies any cardiac or pulmonary issues. She denies diabetes. She does not take any antiplatelet or anticoagulation at home.   Past Medical History:  Past Medical History:  Diagnosis Date  . Anxiety   . Bowel obstruction (HCC)   . Depression   . Neuropathy   . Thyroid disease     Social History: Social History   Social History  . Marital status: Divorced    Spouse name: N/A  . Number of children: N/A  . Years of education: N/A   Occupational History  . Not on file.   Social History Main Topics  . Smoking status: Former Smoker    Packs/day: 1.00    Years: 20.00    Types: Cigarettes  . Smokeless tobacco: Never Used  . 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   . Drug use: No  . Sexual activity: Not on file   Other Topics Concern  . Not on file   Social History Narrative  . No narrative on file   Living arrangements (living alone, with partner): [place]  Family History: Family History  Problem Relation Age of Onset  . Adopted: Yes    Review of Systems:  Review of Systems - General ROS:  Negative Psychological ROS: Negative Ophthalmic ROS: Negative ENT ROS: Negative Hematological and Lymphatic ROS: Negative  Endocrine ROS: Negative Respiratory ROS: Negative Cardiovascular ROS: Negative Gastrointestinal ROS: Negative Genito-Urinary ROS: Negative Musculoskeletal ROS: Positive for back pain Neurological ROS: Positive for leg numbness and weakness Dermatological ROS: Negative  Physical Exam: BP (!) 149/79   Pulse 91   Temp 98 F (36.7 C)   Resp 18   Ht 5\' 6"  (1.676 m)   Wt 82.7 kg (182 lb 4.8 oz)   SpO2 95%   BMI 29.42 kg/m  Body mass index is 29.42 kg/m. Body surface area is 1.96 meters squared. General appearance: Alert, cooperative, in no acute distress Head: Normocephalic, atraumatic Eyes: Normal, EOM intact Oropharynx: Moist without lesions Ext: No edema in LE bilaterally, good distal pulses  Neurologic exam:  Mental status: alertness: alert,  affect: normal Speech: fluent and clear V/VII:no evidence of facial droop or weakness Motor: Strength is full in upper extremities. The left leg is weak throughout with 2/5 strength in hip flexion, knee extension, dorsiflexion, and plantarflexion. Her RLE is full strength Sensory: intact to light touch in all extremities to light touch Gait: not tested given weakness  Laboratory: Results for orders placed or performed during the hospital encounter of 03/27/17  CBC  Result Value Ref Range   WBC 6.5 3.6 - 11.0 K/uL   RBC 4.70  3.80 - 5.20 MIL/uL   Hemoglobin 13.5 12.0 - 16.0 g/dL   HCT 16.1 09.6 - 04.5 %   MCV 85.5 80.0 - 100.0 fL   MCH 28.7 26.0 - 34.0 pg   MCHC 33.6 32.0 - 36.0 g/dL   RDW 40.9 81.1 - 91.4 %   Platelets 215 150 - 440 K/uL  Comprehensive metabolic panel  Result Value Ref Range   Sodium 142 135 - 145 mmol/L   Potassium 3.4 (L) 3.5 - 5.1 mmol/L   Chloride 108 101 - 111 mmol/L   CO2 26 22 - 32 mmol/L   Glucose, Bld 110 (H) 65 - 99 mg/dL   BUN 16 6 - 20 mg/dL   Creatinine, Ser 7.82 (H)  0.44 - 1.00 mg/dL   Calcium 9.4 8.9 - 95.6 mg/dL   Total Protein 7.1 6.5 - 8.1 g/dL   Albumin 4.3 3.5 - 5.0 g/dL   AST 27 15 - 41 U/L   ALT 22 14 - 54 U/L   Alkaline Phosphatase 72 38 - 126 U/L   Total Bilirubin 0.6 0.3 - 1.2 mg/dL   GFR calc non Af Amer 52 (L) >60 mL/min   GFR calc Af Amer 60 (L) >60 mL/min   Anion gap 8 5 - 15  Basic metabolic panel  Result Value Ref Range   Sodium 142 135 - 145 mmol/L   Potassium 3.3 (L) 3.5 - 5.1 mmol/L   Chloride 108 101 - 111 mmol/L   CO2 27 22 - 32 mmol/L   Glucose, Bld 115 (H) 65 - 99 mg/dL   BUN 19 6 - 20 mg/dL   Creatinine, Ser 2.13 (H) 0.44 - 1.00 mg/dL   Calcium 8.9 8.9 - 08.6 mg/dL   GFR calc non Af Amer 51 (L) >60 mL/min   GFR calc Af Amer 59 (L) >60 mL/min   Anion gap 7 5 - 15  CBC  Result Value Ref Range   WBC 5.6 3.6 - 11.0 K/uL   RBC 4.23 3.80 - 5.20 MIL/uL   Hemoglobin 12.2 12.0 - 16.0 g/dL   HCT 57.8 46.9 - 62.9 %   MCV 85.3 80.0 - 100.0 fL   MCH 28.9 26.0 - 34.0 pg   MCHC 33.8 32.0 - 36.0 g/dL   RDW 52.8 41.3 - 24.4 %   Platelets 192 150 - 440 K/uL  CKMB([facility] only)  Result Value Ref Range   CK, MB 2.0 0.5 - 5.0 ng/mL  Sedimentation rate  Result Value Ref Range   Sed Rate 12 0 - 30 mm/hr  Vitamin B12  Result Value Ref Range   Vitamin B-12 251 180 - 914 pg/mL  TSH  Result Value Ref Range   TSH 14.442 (H) 0.350 - 4.500 uIU/mL  RPR  Result Value Ref Range   RPR Ser Ql Non Reactive Non Reactive  Anti-smooth muscle antibody, IgG  Result Value Ref Range   F-Actin IgG 15 0 - 19 Units  Magnesium  Result Value Ref Range   Magnesium 2.0 1.7 - 2.4 mg/dL  Folate  Result Value Ref Range   Folate 7.7 >5.9 ng/mL  T3, free  Result Value Ref Range   T3, Free 2.1 2.0 - 4.4 pg/mL  T4, free  Result Value Ref Range   Free T4 0.84 0.61 - 1.12 ng/dL  Basic metabolic panel  Result Value Ref Range   Sodium 141 135 - 145 mmol/L   Potassium 4.4 3.5 - 5.1 mmol/L   Chloride 109 101 -  111 mmol/L   CO2 27 22 - 32 mmol/L    Glucose, Bld 101 (H) 65 - 99 mg/dL   BUN 16 6 - 20 mg/dL   Creatinine, Ser 8.11 0.44 - 1.00 mg/dL   Calcium 8.8 (L) 8.9 - 10.3 mg/dL   GFR calc non Af Amer 58 (L) >60 mL/min   GFR calc Af Amer >60 >60 mL/min   Anion gap 5 5 - 15  ECHOCARDIOGRAM COMPLETE  Result Value Ref Range   Weight 2,916.8 oz   Height 66 in   BP 150/75 mmHg   I personally reviewed labs  Imaging: MRI Thoracic Spine: 1. 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 2. Mild degenerative changes in the cervical spine without impingement.   Impression/Plan:  Alison. Torres is admitted for left leg weakness that is more acute but recent decline in gait. Imaging of thoracic spine reveals an extramedullary mass that is intrathecal and likely represents a meningioma or nerve sheath tumor. She has no history of cancer. We discussed starting steroids for neuroprotection now but in order to aid in recovery, resection of the mass would be needed. This would also give [place] a definitive diagnosis. The risks of suregry would include paralysis, hemorrhage, infection, and anesthesia risk but the risk of not doing surgery is continued weakness. She would like to proceed to surgery and we will plan on transfer to [facility] for further care as this would required specialized care not able to be performed at [facility].    1.  Diagnosis: Spinal Cord Mass with Cord Compression  2.  Plan - decadron 4mg  q6 hours - Transfer to higher level facility for definitive spinal surgery

## 2017-03-29 NOTE — Care Management Note (Signed)
Case Management Note  Patient Details  Name: Alison HaradaSarah Huguley MRN: [id number] Date of Birth: 03-16-1946  Subjective/Objective:    Admitted to [facility] under observation status with the diagnosis of CVA, Lives alone. Deveron FurlongShirley Cunningham, Aunt, is contact person [phone]). [facility]   Physician care and to get prescriptions filled. [facility].  No home Health. No skilled facility. Independent of all basic and instrumental needs.               Action/Plan:  T-10 mass found on spine MRI . [facility].  [facility]. Refusal of Transfer to [facility] signed. Discharge summary and Refusal [facility] to [facility]    Expected Discharge Date:  03/29/17               Expected Discharge Plan:     In-House Referral:     Discharge planning Services     Post Acute Care Choice:    Choice offered to:     DME Arranged:    DME Agency:     HH Arranged:    HH Agency:     Status of Service:     If discussed at [employer] Meetings, dates discussed:    Additional Comments:  Gwenette GreetBrenda S Giulian Goldring, RNMSN CCM Care Management [phone] 03/29/2017, 2:34 PM

## 2017-03-30 LAB — IMMUNOFIXATION ELECTROPHORESIS
IgA: 126 mg/dL (ref 64–422)
IgG (Immunoglobin G), Serum: 776 mg/dL (ref 700–1600)
IgM (Immunoglobulin M), Srm: 88 mg/dL (ref 26–217)
Total Protein ELP: 6.2 g/dL (ref 6.0–8.5)

## 2017-03-30 LAB — HEAVY METALS, BLOOD
Arsenic: 8 ug/L (ref 2–23)
Lead: 2 ug/dL (ref 0–4)
MERCURY: NOT DETECTED ug/L (ref 0.0–14.9)

## 2017-03-30 LAB — VITAMIN B1: VITAMIN B1 (THIAMINE): 125.5 nmol/L (ref 66.5–200.0)

## 2017-03-30 LAB — COPPER, SERUM: Copper: 92 ug/dL (ref 72–166)

## 2017-03-30 LAB — ACETYLCHOLINE RECEPTOR, BINDING: Acety choline binding ab: <0.03 nmol/L (ref 0.00–0.24)

## 2017-04-01 HISTORY — PX: THORACIC LAMINECTOMY FOR EXCISION OF NEOPLASM: SHX2501

## 2017-04-01 HISTORY — PX: MOHS SURGERY: SHX181

## 2017-04-25 ENCOUNTER — Encounter
Admission: RE | Admit: 2017-04-25 | Discharge: 2017-04-25 | Disposition: A | Discharge status: Home / Self Care | Payer: Medicare Other | Source: Ambulatory Visit | Attending: Internal Medicine | Admitting: Internal Medicine

## 2017-04-26 ENCOUNTER — Other Ambulatory Visit: Payer: Self-pay

## 2017-04-26 MED ORDER — ZOLPIDEM TARTRATE 5 MG PO TABS: 5.0000 mg | ORAL_TABLET | Freq: Every evening | ORAL | 0 refills | Status: DC | PRN

## 2017-04-26 NOTE — Telephone Encounter (Signed)
Rx sent to [facility] phone : [phone] , fax : [phone]  

## 2017-05-03 ENCOUNTER — Encounter: Payer: Self-pay | Admitting: Gerontology

## 2017-05-03 ENCOUNTER — Non-Acute Institutional Stay (SKILLED_NURSING_FACILITY): Payer: Medicare Other | Admitting: Gerontology

## 2017-05-03 ENCOUNTER — Other Ambulatory Visit
Admission: RE | Admit: 2017-05-03 | Discharge: 2017-05-03 | Disposition: A | Discharge status: Home / Self Care | Payer: Medicare Other | Source: Ambulatory Visit | Attending: Internal Medicine | Admitting: Internal Medicine

## 2017-05-03 DIAGNOSIS — F419 Anxiety disorder, unspecified: Secondary | ICD-10-CM | POA: Diagnosis not present

## 2017-05-03 DIAGNOSIS — A0472 Enterocolitis due to Clostridium difficile, not specified as recurrent: Secondary | ICD-10-CM | POA: Diagnosis not present

## 2017-05-03 DIAGNOSIS — M1990 Unspecified osteoarthritis, unspecified site: Secondary | ICD-10-CM | POA: Diagnosis not present

## 2017-05-03 DIAGNOSIS — G9520 Unspecified cord compression: Secondary | ICD-10-CM | POA: Diagnosis not present

## 2017-05-03 DIAGNOSIS — F329 Major depressive disorder, single episode, unspecified: Secondary | ICD-10-CM | POA: Diagnosis not present

## 2017-05-03 DIAGNOSIS — E079 Disorder of thyroid, unspecified: Secondary | ICD-10-CM | POA: Diagnosis not present

## 2017-05-03 DIAGNOSIS — Z09 Encounter for follow-up examination after completed treatment for conditions other than malignant neoplasm: Secondary | ICD-10-CM | POA: Insufficient documentation

## 2017-05-03 LAB — C DIFFICILE QUICK SCREEN W PCR REFLEX
C DIFFICLE (CDIFF) ANTIGEN: NEGATIVE
C Diff interpretation: NOT DETECTED
C Diff toxin: NEGATIVE

## 2017-05-03 NOTE — Progress Notes (Signed)
[facility]:   [facility] Room Number: 206A Place of Service:  SNF [phone]) Provider:  Lorenso Quarry, NP-C  [facility]  Patient Care Team: [facility] as PCP - General (General Practice)  Extended Emergency Contact Information Primary Emergency Contact: Cunningham,Shirley Address: [address]          [place], [place] [address] D[place] of [place] Home Phone: [phone] Relation: Aunt Secondary Emergency Contact: Detrio,Patricia  [place] Home Phone: [phone] Relation: Daughter  Code Status:  FULL Goals of care: Advanced Directive information Advanced Directives 05/03/2017  Does Patient Have a Medical Advance Directive? No  Would patient like information on creating a medical advance directive? -     Chief Complaint  Patient presents with  . Hospitalization Follow-up    Follow up on spinal mass removal    HPI:  Pt is a 71 y.o. female seen today for a hospital f/u s/p admission from [facility] for surgical removal of mass on the spine causing cord compression as C-Diff Colitis. Pt has been working with PT and OT. She continues to have significant left leg weakness, but is slowly improving. Pt reports her pain is well controlled on current regimen. Her appetite is poor, but starting to improve. Pt reports she only had two days of diarrhea, though she tested positive for c-diff while still in the hospital. Pt has been being treated with PO Vancomycin. Last dose was yesterday. Stool sample sent today for re-evaluation s/p completion of treatment tested negative for c-diff. Pt is very happy about this. She is compliant with the back brace. Denies numbness, tingling of BLE. VSS. No other complaints.   Past Medical History:  Diagnosis Date  . Anxiety   . Arthritis   . Bowel obstruction (HCC)   . Depression   . Encounter for blood transfusion   . Neuropathy   . Thyroid disease    Past Surgical History:  Procedure  Laterality Date  . ABDOMINAL HYSTERECTOMY    . ABDOMINAL SURGERY    . APPENDECTOMY    . COLON SURGERY    . HERNIA REPAIR    . LAPAROSCOPIC CHOLECYSTECTOMY    . MOHS SURGERY  04/01/2017   Procedure: MICROSURGICAL TECHNIQUES, REQUIRING USE OF OPERATING MICROSCOPE (LIST IN ADDITION TO PRIMARY PROCEDURE); Surgeon: Bethel Born, MD; Location[facility]; Service: Neurosurgery; Laterality: N/A;  . THORACIC LAMINECTOMY FOR EXCISION OF NEOPLASM  04/01/2017   laminectomy for biopsy/excision of intraspinal neoplasm; extradural thoracic; Surgeon: Bethel Born, MD; Location: [facility] OR; Service: Neurosurgery; Laterality: N/A;    Allergies  Allergen Reactions  . Chlorpromazine     Other reaction(s): Other (See Comments) blindness  . Codeine Nausea Only  . Lorazepam     Other reaction(s): Other (See Comments) Shaking and convulsions   . Sulfa Antibiotics Nausea Only  . Trifluoperazine     Other reaction(s): Hallucination  . Trazodone And Nefazodone     Allergies as of 05/03/2017      Reactions   Chlorpromazine    Other reaction(s): Other (See Comments) blindness   Codeine Nausea Only   Lorazepam    Other reaction(s): Other (See Comments) Shaking and convulsions    Sulfa Antibiotics Nausea Only   Trifluoperazine    Other reaction(s): Hallucination   Trazodone And Nefazodone       Medication List       Accurate as of 05/03/17  2:34 PM. Always use your most recent med list.  acetaminophen 325 MG tablet Commonly known as:  TYLENOL Take 650 mg by mouth 4 (four) times daily.   acetaminophen 325 MG tablet Commonly known as:  TYLENOL Take 650 mg by mouth daily.   aspirin EC 81 MG tablet Take 81 mg by mouth daily.   clonazePAM 0.5 MG tablet Commonly known as:  KLONOPIN Take 0.5 mg by mouth at bedtime.   clonazePAM 0.5 MG tablet Commonly known as:  KLONOPIN Take 0.5 mg by mouth daily as needed for anxiety. Take in addition to 8 pm dose     enoxaparin 40 MG/0.4ML injection Commonly known as:  LOVENOX Inject 40 mg into the skin daily.   LACTOBACILLUS RHAMNOSUS (GG) PO Take 1 capsule by mouth daily.   levothyroxine 100 MCG tablet Commonly known as:  SYNTHROID, LEVOTHROID Take 100 mcg by mouth daily before breakfast.   methocarbamol 500 MG tablet Commonly known as:  ROBAXIN Take 500 mg by mouth every 4 (four) hours as needed for muscle spasms.   mirtazapine 15 MG tablet Commonly known as:  REMERON Take 15 mg by mouth at bedtime.   prochlorperazine 10 MG tablet Commonly known as:  COMPAZINE Take 10 mg by mouth at bedtime.   prochlorperazine 10 MG tablet Commonly known as:  COMPAZINE Take 10 mg by mouth every 6 (six) hours as needed for nausea or vomiting.   simvastatin 20 MG tablet Commonly known as:  ZOCOR Take 20 mg by mouth at bedtime.   VOLTAREN 1 % Gel Generic drug:  diclofenac sodium Apply 4 g topically 4 (four) times daily as needed. Apply to affected area   zolpidem 5 MG tablet Commonly known as:  AMBIEN Take 1 tablet (5 mg total) by mouth at bedtime as needed for sleep.       Review of Systems  Constitutional: Negative for activity change, appetite change, chills, diaphoresis and fever.  HENT: Negative for congestion, sneezing, sore throat, trouble swallowing and voice change.   Respiratory: Negative for apnea, cough, choking, chest tightness, shortness of breath and wheezing.   Cardiovascular: Negative for chest pain, palpitations and leg swelling.  Gastrointestinal: Negative for abdominal distention, abdominal pain, constipation, diarrhea and nausea.  Genitourinary: Negative for difficulty urinating, dysuria, frequency and urgency.  Musculoskeletal: Positive for arthralgias (typical arthritis) and back pain. Negative for gait problem and myalgias.  Skin: Positive for wound. Negative for color change, pallor and rash.  Neurological: Positive for weakness. Negative for dizziness, tremors,  syncope, speech difficulty, numbness and headaches.  Psychiatric/Behavioral: Negative for agitation and behavioral problems.  All other systems reviewed and are negative.    There is no immunization history on file for this patient. Pertinent  Health Maintenance Due  Topic Date Due  . MAMMOGRAM  02/03/1995  . COLONOSCOPY  02/03/1995  . DEXA SCAN  02/02/2010  . PNA vac Low Risk Adult (1 of 2 - PCV13) 02/02/2010  . INFLUENZA VACCINE  03/16/2017   No flowsheet data found. Functional Status Survey:    Vitals:   05/03/17 1341  BP: (!) 157/70  Pulse: 71  Resp: 18  Temp: 98.1 F (36.7 C)  SpO2: 97%  Weight: 172 lb 6.4 oz (78.2 kg)  Height:  (1.676 m)   Body mass index is 27.83 kg/m. Physical Exam  Constitutional: She is oriented to person, place, and time. Vital signs are normal. She appears well-developed and well-nourished. She is active and cooperative. She does not appear ill. No distress.  HENT:  Head: Normocephalic and atraumatic.  Mouth/Throat:  Uvula is midline, oropharynx is clear and moist and mucous membranes are normal. Mucous membranes are not pale, not dry and not cyanotic.  Eyes: Pupils are equal, round, and reactive to light. Conjunctivae, EOM and lids are normal.  Neck: Trachea normal, normal range of motion and full passive range of motion without pain. Neck supple. No JVD present. No tracheal deviation, no edema and no erythema present. No thyromegaly present.  Cardiovascular: Normal rate, regular rhythm, normal heart sounds, intact distal pulses and normal pulses.  Exam reveals no gallop, no distant heart sounds and no friction rub.   No murmur heard. Pulses:      Dorsalis pedis pulses are 2+ on the right side, and 2+ on the left side.  No edema  Pulmonary/Chest: Effort normal and breath sounds normal. No accessory muscle usage. No respiratory distress. She has no decreased breath sounds. She has no wheezes. She has no rhonchi. She has no rales. She  exhibits no tenderness.  Abdominal: Soft. Normal appearance and bowel sounds are normal. She exhibits no distension and no ascites. There is no tenderness.  Musculoskeletal: She exhibits no edema.       Thoracic back: She exhibits decreased range of motion, tenderness and laceration.  Expected osteoarthritis, stiffness; calves soft, supple. Negative Homan's sign  Neurological: She is alert and oriented to person, place, and time. She has normal strength.  Skin: Skin is warm and dry. Laceration (incision) noted. No rash noted. She is not diaphoretic. No cyanosis or erythema. No pallor. Nails show no clubbing.  Psychiatric: She has a normal mood and affect. Her speech is normal and behavior is normal. Judgment and thought content normal. Cognition and memory are normal.  Nursing note and vitals reviewed.   Labs reviewed:  Recent Labs  03/27/17 1800 03/28/17 0416 03/28/17 1122 03/29/17 0558  NA 142 142  --  141  K 3.4* 3.3*  --  4.4  CL 108 108  --  109  CO2 26 27  --  27  GLUCOSE 110* 115*  --  101*  BUN 16 19  --  16  CREATININE 1.05* 1.07*  --  0.95  CALCIUM 9.4 8.9  --  8.8*  MG  --   --  2.0  --     Recent Labs  03/27/17 1800  AST 27  ALT 22  ALKPHOS 72  BILITOT 0.6  PROT 7.1  ALBUMIN 4.3    Recent Labs  03/27/17 1800 03/28/17 0416  WBC 6.5 5.6  HGB 13.5 12.2  HCT 40.2 36.1  MCV 85.5 85.3  PLT 215 192   Lab Results  Component Value Date   TSH 14.442 (H) 03/28/2017   No results found for: HGBA1C No results found for: CHOL, HDL, LDLCALC, LDLDIRECT, TRIG, CHOLHDL  Significant Diagnostic Results in last 30 days:  No results found.  Assessment/Plan 1. Compression of spinal cord (HCC)  Continue PT/OT  Continue exercises as taught by PT/OT  Continue use of back brace when out of bed  Mobility on unit with wheelchair  Skin care per protocol  Follow up with surgeon as instructed  Continue Tramadol 25 mg po Q 6 hours prn pain  Continue APAP 650 mg  po Q 4 hours prn pain  Ice pack to the back prn for pain, edema  2. Clostridium difficile colitis  Resolved  DC Enteric Precautions  Clean pt room thoroughly with bleach  Family/ staff Communication:   Total Time:  Documentation:  Face to Face:  Family/Phone:   Labs/tests ordered:  Stool for C-diff  Brynda Rim, NP-C Geriatrics [facility] [phone] [address], [place] [address] Cell Phone (Mon-Fri 8am-5pm):  [phone] On Call:  [phone] & follow prompts after 5pm & weekends Office Phone:  [phone] Office Fax:  [phone]

## 2017-05-12 ENCOUNTER — Non-Acute Institutional Stay (SKILLED_NURSING_FACILITY): Payer: Medicare Other | Admitting: Gerontology

## 2017-05-12 ENCOUNTER — Encounter: Payer: Self-pay | Admitting: Gerontology

## 2017-05-12 DIAGNOSIS — A0472 Enterocolitis due to Clostridium difficile, not specified as recurrent: Secondary | ICD-10-CM | POA: Diagnosis not present

## 2017-05-12 DIAGNOSIS — G9520 Unspecified cord compression: Secondary | ICD-10-CM

## 2017-05-12 NOTE — Progress Notes (Signed)
Location[facility]   [facility] Room Number: 206A Place of Service:  SNF [phone])  Provider: Lorenso Quarry, NP-C  [facility] Patient Care Team: [facility] PCP - General (General Practice)  Extended Emergency Contact Information Primary Emergency Contact: Idelle Crouch of [place] Home Phone: [phone] Relation: Son Secondary Emergency Contact: Detrio,Patricia  [place] Home Phone: [phone] Relation: Daughter  Code Status: FULL Goals of care:  Advanced Directive information Advanced Directives 05/12/2017  Does Patient Have a Medical Advance Directive? No  Would patient like information on creating a medical advance directive? -     Allergies  Allergen Reactions  . Chlorpromazine     Other reaction(s): Other (See Comments) blindness  . Codeine Nausea Only  . Lorazepam     Other reaction(s): Other (See Comments) Shaking and convulsions   . Sulfa Antibiotics Nausea Only  . Trifluoperazine     Other reaction(s): Hallucination  . Trazodone And Nefazodone     Chief Complaint  Patient presents with  . Discharge Note    Discharged from SNF    HPI:  71 y.o. female seen today for discharge evaluation s/p admission from [facility] for surgical removal of mass on the spine causing cord compression as C-Diff Colitis. Pt has been working with PT and OT. She continues to have some left leg weakness, but is slowly improving. Pt reports her pain is well controlled on current regimen. Her appetite is poor, but starting to improve. Pt reports she only had two days of diarrhea, though she tested positive for c-diff while still in the hospital. Pt has been being treated with PO Vancomycin.Treatment completed. Stool sample sent for re-evaluation s/p completion of treatment tested negative for c-diff. Pt is very happy about this. She is compliant with the back brace. Denies numbness, tingling of BLE. Excited about going  home. VSS. No other complaints.     Past Medical History:  Diagnosis Date  . Anxiety   . Arthritis   . Bowel obstruction (HCC)   . Depression   . Encounter for blood transfusion   . Neuropathy   . Thyroid disease     Past Surgical History:  Procedure Laterality Date  . ABDOMINAL HYSTERECTOMY    . ABDOMINAL SURGERY    . APPENDECTOMY    . COLON SURGERY    . HERNIA REPAIR    . LAPAROSCOPIC CHOLECYSTECTOMY    . MOHS SURGERY  04/01/2017   Procedure: MICROSURGICAL TECHNIQUES, REQUIRING USE OF OPERATING MICROSCOPE (LIST IN ADDITION TO PRIMARY PROCEDURE); Surgeon: Bethel Born, MD; Location: [facility] OR; Service: Neurosurgery; Laterality: N/A;  . THORACIC LAMINECTOMY FOR EXCISION OF NEOPLASM  04/01/2017   laminectomy for biopsy/excision of intraspinal neoplasm; extradural thoracic; Surgeon: Bethel Born, MD; Location: [facility] OR; Service: Neurosurgery; Laterality: N/A;      reports that she has quit smoking. Her smoking use included Cigarettes. She has a 20.00 pack-year smoking history. She has never used smokeless tobacco. She reports that she drinks alcohol. She reports that she does not use drugs. Social History   Social History  . Marital status: Divorced    Spouse name: N/A  . Number of children: 2  . Years of education: 12+   Occupational History  . Not on file.   Social History Main Topics  . Smoking status: Former Smoker    Packs/day: 1.00    Years: 20.00    Types: Cigarettes  . Smokeless tobacco: Never Used  .  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   . Drug use: No  . Sexual activity: Not on file   Other Topics Concern  . Not on file   Social History Narrative   Full Code   Former smoker   Former drinker   2 children   Functional Status Survey:    Allergies  Allergen Reactions  . Chlorpromazine     Other reaction(s): Other (See Comments) blindness  . Codeine Nausea Only  . Lorazepam     Other  reaction(s): Other (See Comments) Shaking and convulsions   . Sulfa Antibiotics Nausea Only  . Trifluoperazine     Other reaction(s): Hallucination  . Trazodone And Nefazodone     Pertinent  Health Maintenance Due  Topic Date Due  . MAMMOGRAM  02/03/1995  . COLONOSCOPY  02/03/1995  . DEXA SCAN  02/02/2010  . PNA vac Low Risk Adult (1 of 2 - PCV13) 02/02/2010  . INFLUENZA VACCINE  03/16/2017    Medications: Allergies as of 05/12/2017      Reactions   Chlorpromazine    Other reaction(s): Other (See Comments) blindness   Codeine Nausea Only   Lorazepam    Other reaction(s): Other (See Comments) Shaking and convulsions    Sulfa Antibiotics Nausea Only   Trifluoperazine    Other reaction(s): Hallucination   Trazodone And Nefazodone       Medication List       Accurate as of 05/12/17 10:01 AM. Always use your most recent med list.          acetaminophen 325 MG tablet Commonly known as:  TYLENOL Take 650 mg by mouth every 4 (four) hours as needed.   aspirin EC 81 MG tablet Take 81 mg by mouth daily.   clonazePAM 0.5 MG tablet Commonly known as:  KLONOPIN Take 0.5 mg by mouth at bedtime.   clonazePAM 0.5 MG tablet Commonly known as:  KLONOPIN Take 0.5 mg by mouth daily as needed for anxiety. Take in addition to 8 pm dose   levothyroxine 100 MCG tablet Commonly known as:  SYNTHROID, LEVOTHROID Take 100 mcg by mouth daily before breakfast.   mirtazapine 15 MG tablet Commonly known as:  REMERON Take 15 mg by mouth at bedtime.   prochlorperazine 10 MG tablet Commonly known as:  COMPAZINE Take 10 mg by mouth at bedtime.   prochlorperazine 10 MG tablet Commonly known as:  COMPAZINE Take 10 mg by mouth daily as needed.   RISA-BID PROBIOTIC Tabs Take 1 tablet by mouth daily.   simvastatin 20 MG tablet Commonly known as:  ZOCOR Take 20 mg by mouth at bedtime.   traMADol 50 MG tablet Commonly known as:  ULTRAM Take 25 mg by mouth every 6 (six) hours as  needed. 1/2 tab   VOLTAREN 1 % Gel Generic drug:  diclofenac sodium Apply 4 g topically 4 (four) times daily as needed. Apply to affected area   zolpidem 5 MG tablet Commonly known as:  AMBIEN Take 1 tablet (5 mg total) by mouth at bedtime as needed for sleep.       Review of Systems  Constitutional: Negative for activity change, appetite change, chills, diaphoresis and fever.  HENT: Negative for congestion, sneezing, sore throat, trouble swallowing and voice change.   Respiratory: Negative for apnea, cough, choking, chest tightness, shortness of breath and wheezing.   Cardiovascular: Negative for chest pain, palpitations and leg swelling.  Gastrointestinal: Negative  for abdominal distention, abdominal pain, constipation, diarrhea and nausea.  Genitourinary: Negative for difficulty urinating, dysuria, frequency and urgency.  Musculoskeletal: Positive for arthralgias (typical arthritis) and back pain. Negative for gait problem and myalgias.  Skin: Positive for wound. Negative for color change, pallor and rash.  Neurological: Positive for weakness. Negative for dizziness, tremors, syncope, speech difficulty, numbness and headaches.  Psychiatric/Behavioral: Negative for agitation and behavioral problems.  All other systems reviewed and are negative.   Vitals:   05/12/17 0940  BP: 128/62  Pulse: 69  Resp: 19  Temp: 97.8 F (36.6 C)  SpO2: 98%  Weight: 173 lb 8 oz (78.7 kg)  Height:  (1.676 m)   Body mass index is 28 kg/m. Physical Exam  Constitutional: She is oriented to person, place, and time. Vital signs are normal. She appears well-developed and well-nourished. She is active and cooperative. She does not appear ill. No distress.  HENT:  Head: Normocephalic and atraumatic.  Mouth/Throat: Uvula is midline, oropharynx is clear and moist and mucous membranes are normal. Mucous membranes are not pale, not dry and not cyanotic.  Eyes: Pupils are equal, round, and reactive  to light. Conjunctivae, EOM and lids are normal.  Neck: Trachea normal, normal range of motion and full passive range of motion without pain. Neck supple. No JVD present. No tracheal deviation, no edema and no erythema present. No thyromegaly present.  Cardiovascular: Normal rate, regular rhythm, normal heart sounds, intact distal pulses and normal pulses.  Exam reveals no gallop, no distant heart sounds and no friction rub.   No murmur heard. Pulses:      Dorsalis pedis pulses are 2+ on the right side, and 2+ on the left side.  No edema  Pulmonary/Chest: Effort normal and breath sounds normal. No accessory muscle usage. No respiratory distress. She has no decreased breath sounds. She has no wheezes. She has no rhonchi. She has no rales. She exhibits no tenderness.  Abdominal: Soft. Normal appearance and bowel sounds are normal. She exhibits no distension and no ascites. There is no tenderness.  Musculoskeletal: She exhibits no edema.       Thoracic back: She exhibits decreased range of motion, tenderness and laceration.  Expected osteoarthritis, stiffness; calves soft, supple. Negative Homan's sign  Neurological: She is alert and oriented to person, place, and time. She has normal strength.  Skin: Skin is warm and dry. Laceration (incision) noted. No rash noted. She is not diaphoretic. No cyanosis or erythema. No pallor. Nails show no clubbing.  Psychiatric: She has a normal mood and affect. Her speech is normal and behavior is normal. Judgment and thought content normal. Cognition and memory are normal.  Nursing note and vitals reviewed.   Labs reviewed: Basic Metabolic Panel:  Recent Labs  16/10/96 1800 03/28/17 0416 03/28/17 1122 03/29/17 0558  NA 142 142  --  141  K 3.4* 3.3*  --  4.4  CL 108 108  --  109  CO2 26 27  --  27  GLUCOSE 110* 115*  --  101*  BUN 16 19  --  16  CREATININE 1.05* 1.07*  --  0.95  CALCIUM 9.4 8.9  --  8.8*  MG  --   --  2.0  --    Liver Function  Tests:  Recent Labs  03/27/17 1800  AST 27  ALT 22  ALKPHOS 72  BILITOT 0.6  PROT 7.1  ALBUMIN 4.3   No results for input(s): LIPASE, AMYLASE in the last  8760 hours. No results for input(s): AMMONIA in the last 8760 hours. CBC:  Recent Labs  03/27/17 1800 03/28/17 0416  WBC 6.5 5.6  HGB 13.5 12.2  HCT 40.2 36.1  MCV 85.5 85.3  PLT 215 192   Cardiac Enzymes:  Recent Labs  03/28/17 1122  CKMB 2.0   BNP: Invalid input(s): POCBNP CBG: No results for input(s): GLUCAP in the last 8760 hours.  Procedures and Imaging Studies During Stay: No results found.  Assessment/Plan:   1. Compression of spinal cord (HCC)  Continue PT/OT  Continue exercises as taught by PT/OT  Continue use of back brace when out of bed  Mobility on unit with wheelchair  Skin care per protocol  Follow up with surgeon as instructed  RX written for Tramadol 25 mg po Q 6 hours prn pain # 20, no refill  Continue APAP 650 mg po Q 4 hours prn pain  Ice pack to the back prn for pain, edema  2. Clostridium difficile colitis  Resolved   Patient is being discharged with the following home health services:  [facility] PT/OT/RN/Aide through [facility]  Patient is being discharged with the following durable medical equipment: W/C, hospital bed    Patient has been advised to f/u with their PCP in 1-2 weeks to bring them up to date on their rehab stay.  Social services at facility was responsible for arranging this appointment.  Pt was provided with a 30 day supply of prescriptions for medications and refills must be obtained from their PCP.  For controlled substances, a more limited supply may be provided adequate until PCP appointment only.  Future labs/tests needed:    Family/ staff Communication:   Total Time:  Documentation:  Face to Face:  Family/Phone:  Brynda Rim, NP-C Geriatrics [facility] [address], [place] [address] Cell  Phone (Mon-Fri 8am-5pm):  [phone] On Call:  [phone] & follow prompts after 5pm & weekends Office Phone:  [phone] Office Fax:  [phone]

## 2017-05-13 ENCOUNTER — Other Ambulatory Visit: Payer: Self-pay

## 2017-05-13 MED ORDER — ZOLPIDEM TARTRATE 5 MG PO TABS: 5.0000 mg | ORAL_TABLET | Freq: Every evening | ORAL | 0 refills | Status: AC | PRN

## 2017-05-13 NOTE — Telephone Encounter (Signed)
Rx sent to [facility] phone : [phone] , fax : [phone]  

## 2017-05-16 ENCOUNTER — Encounter: Admission: RE | Admit: 2017-05-16 | Payer: Medicare Other | Source: Ambulatory Visit | Admitting: Internal Medicine

## 2017-07-06 ENCOUNTER — Other Ambulatory Visit: Payer: Self-pay | Admitting: Neurosurgery

## 2017-07-06 DIAGNOSIS — G9589 Other specified diseases of spinal cord: Secondary | ICD-10-CM

## 2017-08-31 ENCOUNTER — Ambulatory Visit
Admission: RE | Admit: 2017-08-31 | Discharge: 2017-08-31 | Disposition: A | Discharge status: Home / Self Care | Payer: Medicare Other | Source: Ambulatory Visit | Attending: Neurosurgery | Admitting: Neurosurgery

## 2017-08-31 DIAGNOSIS — G9589 Other specified diseases of spinal cord: Secondary | ICD-10-CM

## 2017-08-31 DIAGNOSIS — G959 Disease of spinal cord, unspecified: Secondary | ICD-10-CM | POA: Insufficient documentation

## 2017-08-31 DIAGNOSIS — Z9889 Other specified postprocedural states: Secondary | ICD-10-CM | POA: Insufficient documentation

## 2017-08-31 LAB — POCT I-STAT CREATININE: Creatinine, Ser: 1 mg/dL (ref 0.44–1.00)

## 2017-08-31 MED ORDER — GADOBENATE DIMEGLUMINE 529 MG/ML IV SOLN
15.0000 mL | Freq: Once | INTRAVENOUS | Status: AC | PRN
  Administered 2017-08-31: 15 mL via INTRAVENOUS

## 2017-09-14 ENCOUNTER — Other Ambulatory Visit: Payer: Self-pay | Admitting: Neurosurgery

## 2017-09-14 DIAGNOSIS — G9589 Other specified diseases of spinal cord: Secondary | ICD-10-CM

## 2017-12-09 ENCOUNTER — Ambulatory Visit
Admission: RE | Admit: 2017-12-09 | Discharge: 2017-12-09 | Disposition: A | Discharge status: Home / Self Care | Payer: Medicare Other | Source: Ambulatory Visit | Attending: Neurosurgery | Admitting: Neurosurgery

## 2017-12-09 DIAGNOSIS — G959 Disease of spinal cord, unspecified: Secondary | ICD-10-CM | POA: Insufficient documentation

## 2017-12-09 DIAGNOSIS — G9589 Other specified diseases of spinal cord: Secondary | ICD-10-CM

## 2017-12-09 MED ORDER — GADOBENATE DIMEGLUMINE 529 MG/ML IV SOLN
15.0000 mL | Freq: Once | INTRAVENOUS | Status: AC | PRN
  Administered 2017-12-09: 15 mL via INTRAVENOUS

## 2019-01-18 ENCOUNTER — Other Ambulatory Visit (HOSPITAL_COMMUNITY): Payer: Self-pay | Admitting: Neurosurgery

## 2019-01-18 ENCOUNTER — Other Ambulatory Visit: Payer: Self-pay | Admitting: Neurosurgery

## 2019-01-18 DIAGNOSIS — G9589 Other specified diseases of spinal cord: Secondary | ICD-10-CM

## 2019-03-13 ENCOUNTER — Ambulatory Visit: Payer: Medicare Other

## 2019-07-18 ENCOUNTER — Other Ambulatory Visit: Payer: Self-pay | Admitting: Internal Medicine

## 2019-07-18 DIAGNOSIS — Z1231 Encounter for screening mammogram for malignant neoplasm of breast: Secondary | ICD-10-CM

## 2021-09-23 ENCOUNTER — Other Ambulatory Visit: Payer: Self-pay | Admitting: Neurosurgery

## 2021-09-23 DIAGNOSIS — G9589 Other specified diseases of spinal cord: Secondary | ICD-10-CM

## 2021-10-03 ENCOUNTER — Other Ambulatory Visit: Payer: Self-pay

## 2021-10-03 ENCOUNTER — Ambulatory Visit
Admission: RE | Admit: 2021-10-03 | Discharge: 2021-10-03 | Disposition: A | Discharge status: Home / Self Care | Payer: Medicare Other | Source: Ambulatory Visit | Attending: Neurosurgery | Admitting: Neurosurgery

## 2021-10-03 DIAGNOSIS — G9589 Other specified diseases of spinal cord: Secondary | ICD-10-CM | POA: Insufficient documentation

## 2021-10-03 MED ORDER — GADOBUTROL 1 MMOL/ML IV SOLN
7.5000 mL | Freq: Once | INTRAVENOUS | Status: AC | PRN
  Administered 2021-10-03: 7.5 mL via INTRAVENOUS

## 2022-08-12 ENCOUNTER — Other Ambulatory Visit: Payer: Self-pay

## 2022-08-12 DIAGNOSIS — G9589 Other specified diseases of spinal cord: Secondary | ICD-10-CM

## 2022-10-18 ENCOUNTER — Ambulatory Visit: Admission: RE | Admit: 2022-10-18 | Payer: Medicare Other | Source: Ambulatory Visit

## 2022-12-11 IMAGING — MR MR THORACIC SPINE WO/W CM
7 of 9 series · 30 of 48 positions shown · IV contrast (gadavist)
Comparison: Thoracic MRI 12/09/2017 and earlier.

CLINICAL DATA: 75-year-old female status post 7875 resection of T10
intrathecal extramedullary mass, meningioma. Subsequent encounter.

EXAM:
MRI THORACIC WITHOUT AND WITH CONTRAST
TECHNIQUE: Multiplanar and multiecho pulse sequences of the thoracic spine were
obtained without and with intravenous contrast.
CONTRAST:  7.5mL GADAVIST GADOBUTROL 1 MMOL/ML IV SOLN

[Series 18: T1 · sagittal · 6.0mm · 1.41mm/px · 3 of 9 slices shown (1 of 3)]
[im 1/9]
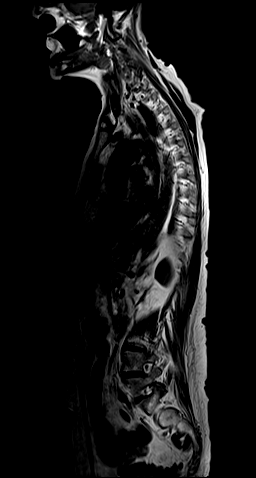
[im 5/9]
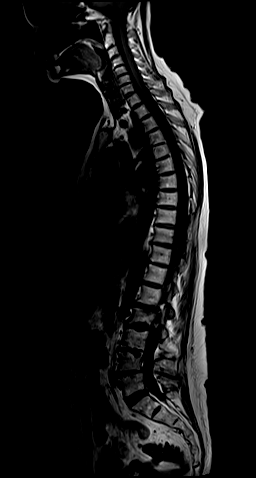
[im 9/9]
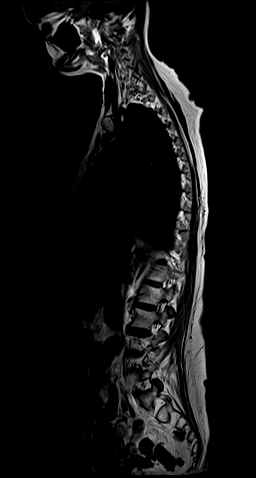

[Series 19: T2 · sagittal · 3.0mm · 1.06mm/px · 4 of 17 slices shown (1 of 2)]
[im 1/17]
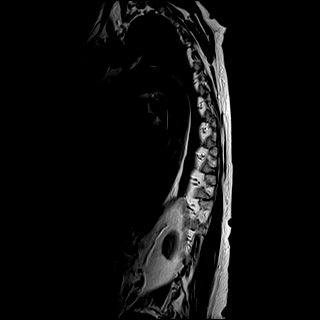
[im 6/17]
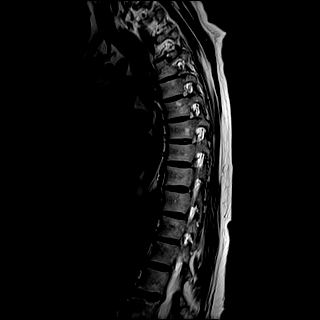
[im 11/17]
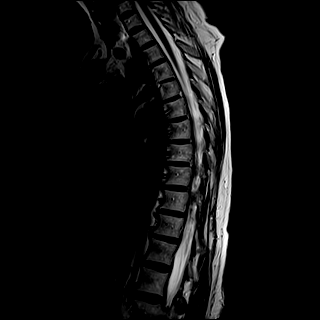
[im 17/17]
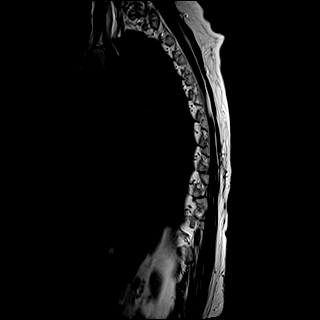

[Series 20: T1 · sagittal · 3.0mm · 1.06mm/px · 3 of 17 slices shown (2 of 3)]
[im 1/17]
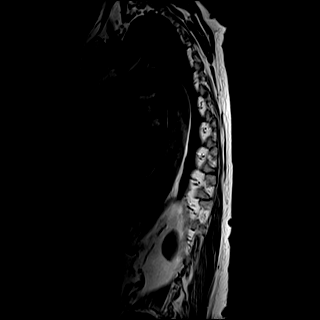
[im 9/17]
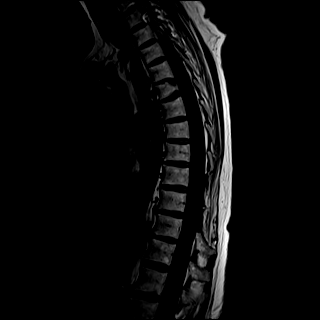
[im 17/17]
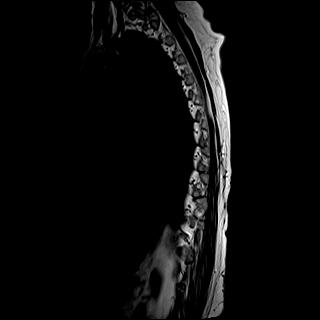

[Series 21: STIR · sagittal · 3.0mm · 0.53mm/px · 1 of 17 slices shown]
[im 1/17]
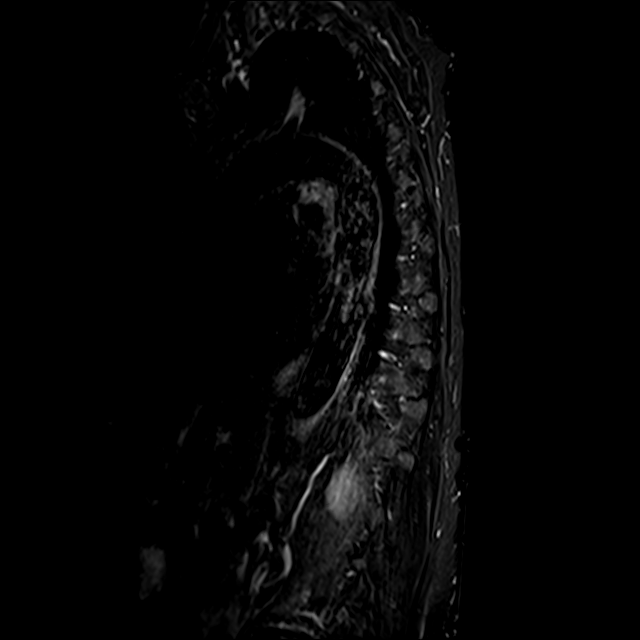

[Series 22: T2 · axial · 4.0mm · 0.74mm/px · z∈[-233,-29]mm · 8 of 42 slices shown (2 of 2)]
[im 1/42]
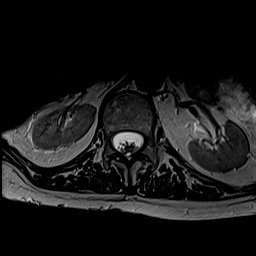
[im 6/42]
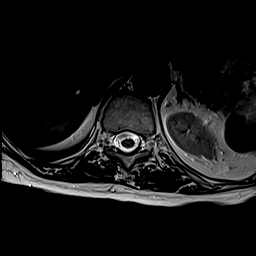
[im 12/42]
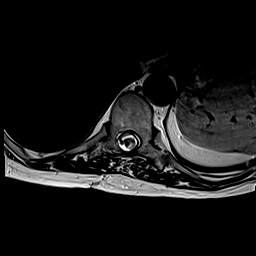
[im 18/42]
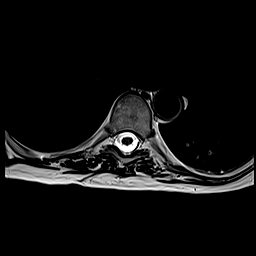
[im 24/42]
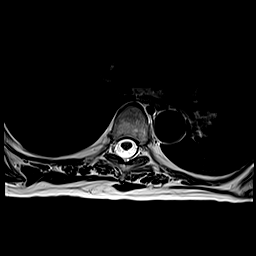
[im 30/42]
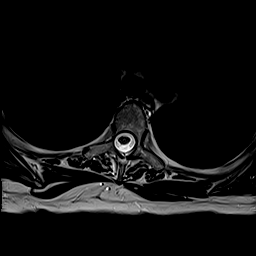
[im 36/42]
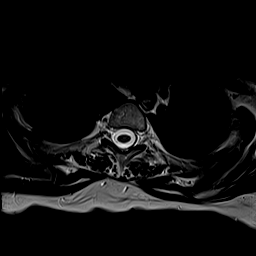
[im 42/42]
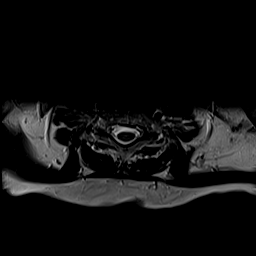

[Series 24: T1 · axial · non-contrast · 4.0mm · 0.37mm/px · z∈[-233,-29]mm · 8 of 42 slices shown (3 of 3)]
[im 1/42]
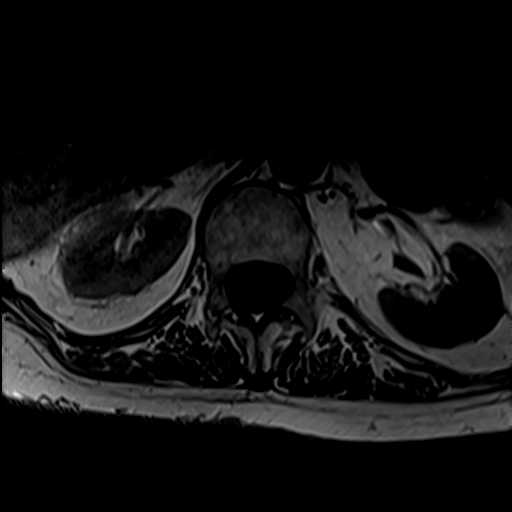
[im 6/42]
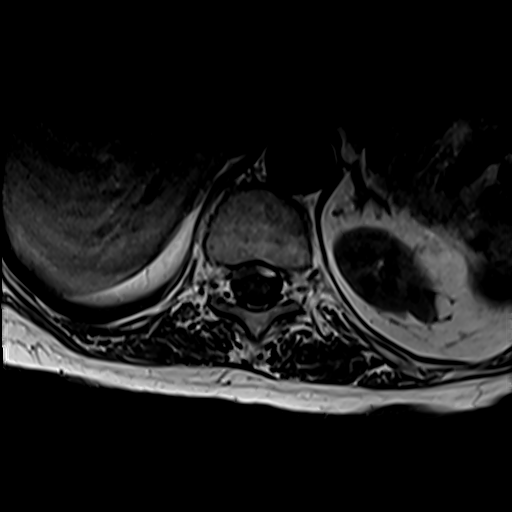
[im 12/42]
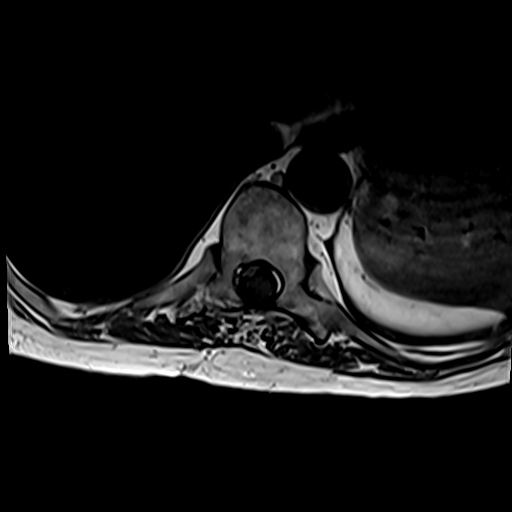
[im 18/42]
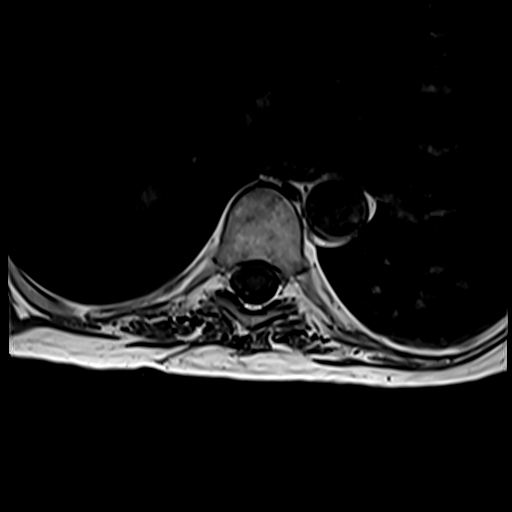
[im 24/42]
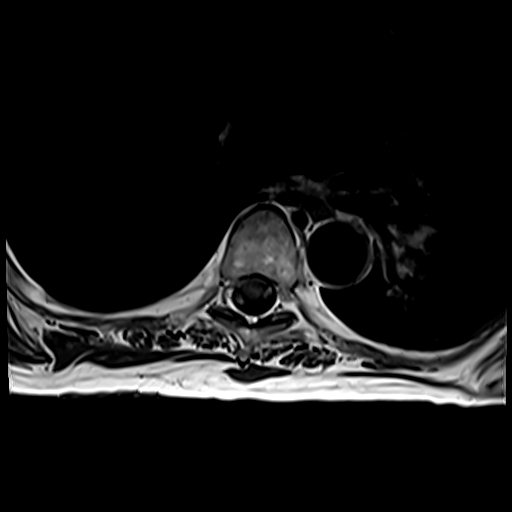
[im 30/42]
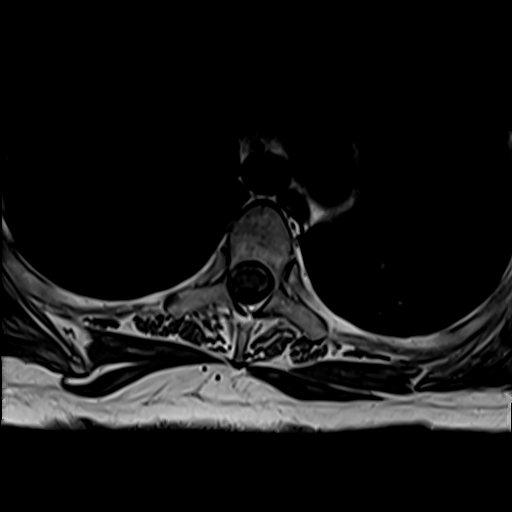
[im 36/42]
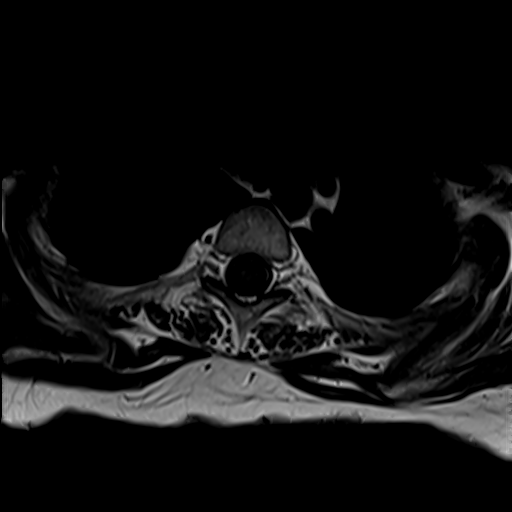
[im 42/42]
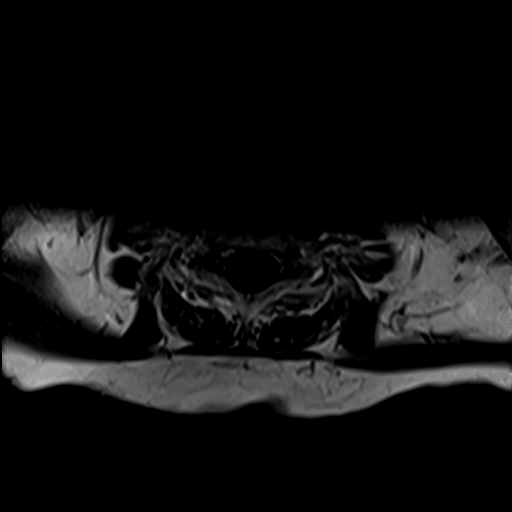

[Series 27: T1 fat-sat · sagittal · 3.0mm · 1.06mm/px · 3 of 17 slices shown]
[im 1/17]
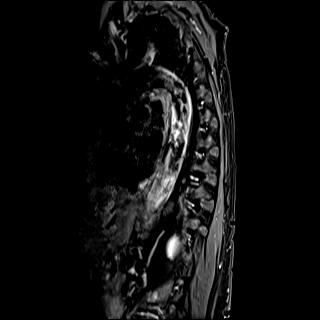
[im 9/17]
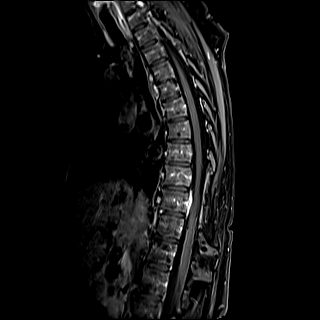
[im 17/17]
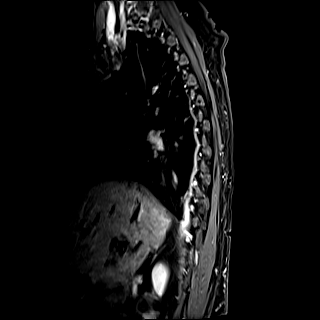

[30 of 48 positions shown; findings below may reference images not displayed]

FINDINGS: Limited cervical spine imaging:  Stable since 1479.

Thoracic spine segmentation: Appears to be normal, same numbering
system used as on the prior exam.

Alignment: Stable thoracic kyphosis. No significant
spondylolisthesis.

Vertebrae: Chronic postoperative changes to the posterior elements
of T9 and T10. T5 congenital butterfly vertebra. Visualized bone
marrow signal is within normal limits. No marrow edema or evidence
of acute osseous abnormality.

Cord: Thoracic spinal cord signal and morphology appears normal
above and below the T9 and T10 levels - which are detailed below.
Conus medullaris remains normal at T12-L1.

Paraspinal and other soft tissues: Largely resolved enhancing
postoperative granulation tissue seen posterior to T9 and T10 in
1479. Negative visible chest. Upper abdominal viscera appears stable
and within normal limits.

Disc levels:

T1-T2 through T8-T9 are negative.

In 7875 a 2.4 cm round enhancing mass filled the spinal canal
symmetrically at T10, with superimposed ventral dural thickening.

Currently spinal cord deformity begins at the T9-T10 disc level,
with slightly increased left posterolateral cord T2 hyperintensity
since 1479 which may be myelomalacia (series 22, image 30). Ventral
dural thickening eccentric to the right at T9 and T10 has not
regressed, and appears slightly increased at the mid T10 level since
1479 (series 27, image 8 and series 25, image 32). Contralateral
left intraspinal nodular and homogeneously enhancing mass now is 8 x
5 x 17 mm (AP by transverse by CC) and has enlarged since 1479
(roughly 7 x 4 x 14 mm at that time). Subsequent deformity of the
normal round cord shape is increased also (series 22, image 34 at
the T10-T11 disc space level versus series 7, image 34 on
12/09/2017) with chronic increased cord T2 signal there not
significantly changed. Bilateral T9 and T10 neural foramina appear
to remain normal.

T11-T12 through L1-L2 are negative.
IMPRESSION: 1. Recurrent small nodular spinal meningioma centered at the T10
level in the left lateral spinal canal (8 x 5 x 17 mm versus 7 x 4 x
14 mm in 1479). Mildly increased associated spinal cord deformity
and myelomalacia at that level since 1479. And continued right
ventral dural thickening at T10. No foraminal involvement.

2. Both above and below the T9 and T10 levels the thoracic spinal
cord and spinal canal remain normal.

3. Incidentally noted congenital T5 butterfly vertebra.

## 2022-12-20 ENCOUNTER — Ambulatory Visit: Admission: RE | Admit: 2022-12-20 | Payer: Medicare Other | Source: Ambulatory Visit

## 2024-05-14 ENCOUNTER — Ambulatory Visit: Admitting: Orthopedic Surgery
